# Patient Record
Sex: Male | Born: 2000 | Race: Black or African American | Hispanic: No | Marital: Single | Smoking: Current every day smoker
Health system: Southern US, Community
[De-identification: ages and names within clinical notes are randomized; demographics above are authoritative.]

## PROBLEM LIST (undated history)

## (undated) HISTORY — PX: KNEE ARTHROSCOPY WITH ANTERIOR CRUCIATE LIGAMENT (ACL) REPAIR: SHX5644

---
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
## 2022-10-25 ENCOUNTER — Other Ambulatory Visit: Payer: Self-pay

## 2022-10-25 ENCOUNTER — Emergency Department
Admission: EM | Admit: 2022-10-25 | Discharge: 2022-10-25 | Disposition: A | Discharge status: Home / Self Care | Payer: Managed Care, Other (non HMO) | Attending: Emergency Medicine | Admitting: Emergency Medicine

## 2022-10-25 ENCOUNTER — Encounter: Payer: Self-pay | Admitting: *Deleted

## 2022-10-25 DIAGNOSIS — L0231 Cutaneous abscess of buttock: Secondary | ICD-10-CM | POA: Diagnosis present

## 2022-10-25 MED ORDER — LIDOCAINE-EPINEPHRINE 1 %-1:100000 IJ SOLN
20.0000 mL | Freq: Once | INTRAMUSCULAR | Status: AC
  Administered 2022-10-25: 20 mL
  Filled 2022-10-25: qty 1

## 2022-10-25 MED ORDER — CEPHALEXIN 500 MG PO CAPS
500.0000 mg | ORAL_CAPSULE | Freq: Once | ORAL | Status: AC
  Administered 2022-10-25: 500 mg via ORAL
  Filled 2022-10-25: qty 1

## 2022-10-25 MED ORDER — CEPHALEXIN 500 MG PO CAPS: 500.0000 mg | ORAL_CAPSULE | Freq: Four times a day (QID) | ORAL | 0 refills | Status: AC

## 2022-10-25 NOTE — ED Provider Notes (Signed)
[facility] Provider Note    Event Date/Time   First MD Initiated Contact with Patient 10/25/22 1509     (approximate)   History   Insect Bite and Abscess   HPI  Brandon Owen is a 22 y.o. male   Past medical history of no significant past medical history presents the emergency department with a bump on his left buttock for the last 3 days, painful, redness.  No systemic symptoms of illness like fever or chills.      Physical Exam   Triage Vital Signs: ED Triage Vitals  Encounter Vitals Group     BP 10/25/22 1239 130/73     Systolic BP Percentile --      Diastolic BP Percentile --      Pulse Rate 10/25/22 1239 83     Resp 10/25/22 1239 18     Temp 10/25/22 1239 98.5 F (36.9 C)     Temp Source 10/25/22 1239 Oral     SpO2 10/25/22 1239 98 %     Weight 10/25/22 1237 190 lb (86.2 kg)     Height 10/25/22 1237 6\' 2"  (1.88 m)     Head Circumference --      Peak Flow --      Pain Score 10/25/22 1237 8     Pain Loc --      Pain Education --      Exclude from Growth Chart --     Most recent vital signs: Vitals:   10/25/22 1239  BP: 130/73  Pulse: 83  Resp: 18  Temp: 98.5 F (36.9 C)  SpO2: 98%    General: Awake, no distress.  CV:  Good peripheral perfusion.  Resp:  Normal effort.  Abd:  No distention.  Other:  Indurated cellulitic change with this tiny scab overlying the left buttock.  No obvious fluctuance.  No fever nontoxic appearing patient, ultrasound with small fluid collection   ED Results / Procedures / Treatments   Labs (all labs ordered are listed, but only abnormal results are displayed) Labs Reviewed - No data to display  PROCEDURES:  Critical Care performed: No  ..Incision and Drainage  Date/Time: 10/25/2022 4:21 PM  Performed by: Pilar Jarvis, MD Authorized by: Pilar Jarvis, MD   Consent:    Consent obtained:  Verbal   Consent given by:  Patient   Risks discussed:  Bleeding, damage to other organs,  incomplete drainage and infection   Alternatives discussed:  No treatment and delayed treatment Universal protocol:    Procedure explained and questions answered to patient or proxy's satisfaction: yes     Patient identity confirmed:  Verbally with patient Location:    Type:  Abscess   Size:  3cm   Location:  Anogenital   Anogenital location: buttock L. Pre-procedure details:    Skin preparation:  Chlorhexidine with alcohol Sedation:    Sedation type:  None Anesthesia:    Anesthesia method:  Local infiltration   Local anesthetic:  Lidocaine 1% WITH epi Procedure type:    Complexity:  Complex Procedure details:    Incision types:  Stab incision   Incision depth:  Dermal   Wound management:  Probed and deloculated   Drainage:  Bloody and purulent   Drainage amount:  Scant   Wound treatment:  Wound left open   Packing materials:  None Post-procedure details:    Procedure completion:  Tolerated    MEDICATIONS ORDERED IN ED: Medications  lidocaine-EPINEPHrine (XYLOCAINE W/EPI) 1 %-1:100000 (with  pres) injection 20 mL (20 mLs Other Given 10/25/22 1604)  cephALEXin (KEFLEX) capsule 500 mg (500 mg Oral Given 10/25/22 1603)     IMPRESSION / MDM / ASSESSMENT AND PLAN / ED COURSE  I reviewed the triage vital signs and the nursing notes.                                Patient's presentation is most consistent with acute presentation with potential threat to life or bodily function.  Differential diagnosis includes, but is not limited to, abscess/cellulitis   The patient is on the cardiac monitor to evaluate for evidence of arrhythmia and/or significant heart rate changes.  MDM:    Abscess drained, covered for cellulitis, tolerated well.  No signs of sepsis.  No signs of necrotizing fasciitis.  Discharge.      FINAL CLINICAL IMPRESSION(S) / ED DIAGNOSES   Final diagnoses:  Left buttock abscess     Rx / DC Orders   ED Discharge Orders          Ordered     cephALEXin (KEFLEX) 500 MG capsule  4 times daily        10/25/22 1559             Note:  This document was prepared using Dragon voice recognition software and may include unintentional dictation errors.    Pilar Jarvis, MD 10/25/22 [phone]

## 2022-10-25 NOTE — Discharge Instructions (Addendum)
Please keep your wound clean by washing at least daily with soap and water. If you see any signs of infection like spreading redness, pus coming from the wound, extreme pain, fevers, chills or any other worsening doctor right away or come back to the emergency department.  Take antibiotics.   Thank you for choosing [place] for your health care today!  Please see your primary doctor this week for a follow up appointment.   If you have any new, worsening, or unexpected symptoms call your doctor right away or come back to the emergency department for reevaluation.  It was my pleasure to care for you today.   Daneil Dan Modesto Charon, MD

## 2022-10-25 NOTE — ED Triage Notes (Signed)
Here by POV from home for L upper outer buttocks redness and hardness c/w insect bite/ abscess, here for "spider bite". Redness and induration noted. Alert, NAD, calm, steady gait. Denies other sx. Denies fever or drainage. Denies meds or topicals. Has cleaned with alcohol. First noticed 2d ago.

## 2023-04-01 ENCOUNTER — Ambulatory Visit: Admitting: Family Medicine

## 2023-04-01 ENCOUNTER — Encounter: Payer: Self-pay | Admitting: Family Medicine

## 2023-04-01 VITALS — BP 138/89 | HR 77 | Temp 98.8°F | Resp 20 | Ht 74.0 in | Wt 232.0 lb

## 2023-04-01 DIAGNOSIS — M79644 Pain in right finger(s): Secondary | ICD-10-CM | POA: Diagnosis not present

## 2023-04-01 DIAGNOSIS — G6289 Other specified polyneuropathies: Secondary | ICD-10-CM

## 2023-04-01 DIAGNOSIS — G629 Polyneuropathy, unspecified: Secondary | ICD-10-CM | POA: Insufficient documentation

## 2023-04-01 MED ORDER — GABAPENTIN 100 MG PO CAPS: 100.0000 mg | ORAL_CAPSULE | Freq: Three times a day (TID) | ORAL | 3 refills | Status: AC

## 2023-04-01 NOTE — Progress Notes (Signed)
 New Patient Office Visit  Subjective    Patient ID: Brandon Owen, male    DOB: 2000/07/29  Age: 23 y.o. MRN: [id number]  CC:  Chief Complaint  Patient presents with   Leg Pain   Hand Pain    Slammed hand in car door 3 wks ago. Hard to make finger go straight.   Mass    Middle of chest from prior brace wearing in middle school    HPI Brandon Owen presents to establish care Delightful 23 yo who got his right hand caught in the door of his car when the wind slammed the door 3 weeks ago.  He had pain and swelling over his fifth metacarpal distally and at the PIP joint.  The pain and swelling have subsided but now he has weakness when he tries to Abduct his extended finger.  He cannot get the finger fully abducted. For the last 2 to 3 years he has had bilateral burning in his feet and itching.  He takes his socks and shoes off pulls his feet down and the symptoms abate.  Works on concrete 6 to 7 hours a day with [employer].  Reports no back pain and no lancing pains in his legs.  Does not have itching or burning anywhere else. He has a history of an ACL tear repair left.  Outpatient Encounter Medications as of 04/01/2023  Medication Sig   gabapentin (NEURONTIN) 100 MG capsule Take 1 capsule (100 mg total) by mouth 3 (three) times daily.   meloxicam (MOBIC) 15 MG tablet Take 1 tablet (15 mg total) by mouth daily.   No facility-administered encounter medications on file as of 04/01/2023.    History reviewed. No pertinent past medical history.  Past Surgical History:  Procedure Laterality Date   KNEE ARTHROSCOPY WITH ANTERIOR CRUCIATE LIGAMENT (ACL) REPAIR Left     Family History  Problem Relation Age of Onset   GER disease Brother     Social History   Socioeconomic History   Marital status: Single    Spouse name: Not on file   Number of children: Not on file   Years of education: Not on file   Highest education level: Not on file  Occupational History   Not on file  Tobacco  Use   Smoking status: Every Day    Types: Cigars    Passive exposure: Current   Smokeless tobacco: Never  Substance and Sexual Activity   Alcohol use: Yes   Drug use: Yes    Types: Marijuana   Sexual activity: Not on file  Other Topics Concern   Not on file  Social History Narrative   Not on file   Social Drivers of Health   Financial Resource Strain: Not on file  Food Insecurity: Not on file  Transportation Needs: Not on file  Physical Activity: Not on file  Stress: Not on file  Social Connections: Not on file  Intimate Partner Violence: Not on file    ROS      Objective   BP 138/89 (BP Location: Left Arm, Patient Position: Sitting, Cuff Size: Normal)   Pulse 77   Temp 98.8 F (37.1 C) (Oral)   Resp 20   Ht 6\' 2"  (1.88 m)   Wt 232 lb (105.2 kg)   SpO2 96%   BMI 29.79 kg/m    Physical Exam Vitals and nursing note reviewed.  Constitutional:      Appearance: Normal appearance.  HENT:     Head: Normocephalic and  atraumatic.  Eyes:     Conjunctiva/sclera: Conjunctivae normal.  Cardiovascular:     Rate and Rhythm: Normal rate and regular rhythm.  Pulmonary:     Effort: Pulmonary effort is normal.     Breath sounds: Normal breath sounds.  Musculoskeletal:     Right lower leg: No edema.     Left lower leg: No edema.  Skin:    General: Skin is warm and dry.  Neurological:     Mental Status: He is alert and oriented to person, place, and time.  Psychiatric:        Mood and Affect: Mood normal.        Behavior: Behavior normal.        Thought Content: Thought content normal.        Judgment: Judgment normal.      oot Exam  Perform Detailed exam:1} Diabetic foot exam was performed with the following findings:   No deformities, ulcerations, or other skin breakdown Normal sensation of 10g monofilament Intact posterior tibialis and dorsalis pedis pulses Pes planus bilaterally.        The ASCVD Risk score (Arnett DK, et al., 2019) failed to  calculate for the following reasons:   The 2019 ASCVD risk score is only valid for ages 24 to 24     Assessment & Plan:  Finger pain, right Assessment & Plan: He is having weakness when he tries to Abduct his extended finger.  Got is caught in a car door.  He has been icing it down and the swelling has abated.  Will check x-ray and refer to Ortho  Orders: -     DG Finger Little Right; Future  Other polyneuropathy Assessment & Plan: Reports he has intense itching of his feet and burning sensation.  Takes his shoes off and airs and cools feet and sensation stops.  No rash on feet.  Normal monofilament exam. Has been happening for 2-3 years.  Positive family history of DMT2.  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  Wears new balance tennis shoes.  Checking EMG and NCS.  Trial of gabapentin 100 mg TID.  Orders: -     Ambulatory referral to Neurology  Other orders -     Gabapentin; Take 1 capsule (100 mg total) by mouth 3 (three) times daily.  Dispense: 90 capsule; Refill: 3    Return in about 4 weeks (around 04/29/2023).   Alease Medina, MD

## 2023-04-01 NOTE — Assessment & Plan Note (Addendum)
 Reports he has intense itching of his feet and burning sensation.  Takes his shoes off and airs and cools feet and sensation stops.  No rash on feet.  Normal monofilament exam. Has been happening for 2-3 years.  Positive family history of DMT2.  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  Wears new balance tennis shoes.  Checking EMG and NCS.  Trial of gabapentin 100 mg TID.

## 2023-04-01 NOTE — Assessment & Plan Note (Signed)
 He is having weakness when he tries to Abduct his extended finger.  Got is caught in a car door.  He has been icing it down and the swelling has abated.  Will check x-ray and refer to Ortho

## 2023-05-01 ENCOUNTER — Ambulatory Visit: Admitting: Family Medicine

## 2024-01-17 ENCOUNTER — Emergency Department
Admission: EM | Admit: 2024-01-17 | Discharge: 2024-01-18 | Disposition: A | Discharge status: Other Acute Inpatient Hospital

## 2024-01-17 ENCOUNTER — Other Ambulatory Visit: Payer: Self-pay

## 2024-01-17 DIAGNOSIS — W268XXA Contact with other sharp object(s), not elsewhere classified, initial encounter: Secondary | ICD-10-CM | POA: Insufficient documentation

## 2024-01-17 DIAGNOSIS — Z5900 Homelessness unspecified: Secondary | ICD-10-CM | POA: Insufficient documentation

## 2024-01-17 DIAGNOSIS — F419 Anxiety disorder, unspecified: Secondary | ICD-10-CM | POA: Insufficient documentation

## 2024-01-17 DIAGNOSIS — S61512A Laceration without foreign body of left wrist, initial encounter: Secondary | ICD-10-CM

## 2024-01-17 DIAGNOSIS — F322 Major depressive disorder, single episode, severe without psychotic features: Secondary | ICD-10-CM | POA: Insufficient documentation

## 2024-01-17 DIAGNOSIS — R45851 Suicidal ideations: Secondary | ICD-10-CM | POA: Insufficient documentation

## 2024-01-17 DIAGNOSIS — Z23 Encounter for immunization: Secondary | ICD-10-CM | POA: Insufficient documentation

## 2024-01-17 DIAGNOSIS — Z79899 Other long term (current) drug therapy: Secondary | ICD-10-CM | POA: Insufficient documentation

## 2024-01-17 DIAGNOSIS — S51812A Laceration without foreign body of left forearm, initial encounter: Secondary | ICD-10-CM | POA: Insufficient documentation

## 2024-01-17 LAB — COMPREHENSIVE METABOLIC PANEL WITH GFR
ALT: 32 U/L (ref 0–44)
AST: 24 U/L (ref 15–41)
Albumin: 4.7 g/dL (ref 3.5–5.0)
Alkaline Phosphatase: 87 U/L (ref 38–126)
Anion gap: 15 (ref 5–15)
BUN: 8 mg/dL (ref 6–20)
CO2: 21 mmol/L — ABNORMAL LOW (ref 22–32)
Calcium: 9 mg/dL (ref 8.9–10.3)
Chloride: 104 mmol/L (ref 98–111)
Creatinine, Ser: 1.23 mg/dL (ref 0.61–1.24)
GFR, Estimated: >60 mL/min
Glucose, Bld: 95 mg/dL (ref 70–99)
Potassium: 3.6 mmol/L (ref 3.5–5.1)
Sodium: 140 mmol/L (ref 135–145)
Total Bilirubin: 0.5 mg/dL (ref 0.0–1.2)
Total Protein: 7.7 g/dL (ref 6.5–8.1)

## 2024-01-17 LAB — CBC
HCT: 44.5 % (ref 39.0–52.0)
Hemoglobin: 15.6 g/dL (ref 13.0–17.0)
MCH: 29.8 pg (ref 26.0–34.0)
MCHC: 35.1 g/dL (ref 30.0–36.0)
MCV: 85.1 fL (ref 80.0–100.0)
Platelets: 322 K/uL (ref 150–400)
RBC: 5.23 MIL/uL (ref 4.22–5.81)
RDW: 12.4 % (ref 11.5–15.5)
WBC: 8.9 K/uL (ref 4.0–10.5)
nRBC: 0 % (ref 0.0–0.2)

## 2024-01-17 LAB — ETHANOL: Alcohol, Ethyl (B): <15 mg/dL

## 2024-01-17 MED ORDER — LIDOCAINE-EPINEPHRINE 2 %-1:100000 IJ SOLN
1.7000 mL | Freq: Once | INTRAMUSCULAR | Status: AC
  Administered 2024-01-17: 1.7 mL via INTRADERMAL
  Filled 2024-01-17: qty 1

## 2024-01-17 NOTE — ED Provider Notes (Signed)
 "  [facility] Provider Note    Event Date/Time   First MD Initiated Contact with Patient 01/17/24 2148     (approximate)   History   Chief Complaint Suicide Attempt   HPI  Brandon Owen is a 24 y.o. male with no significant past medical history who presents to the ED complaining of suicide attempt.  Patient reports that just prior to arrival he attempted to end his life by cutting his left arm with a device used to trim eyelashes.  He was subsequently brought to the ED by family, reports significant bleeding at home but no ongoing bleeding here in the ED.  He denies any difficulty moving his left arm or hand, does not have any numbness or tingling.     Physical Exam   Triage Vital Signs: ED Triage Vitals  Encounter Vitals Group     BP 01/17/24 2148 (!) 149/92     Girls Systolic BP Percentile --      Girls Diastolic BP Percentile --      Boys Systolic BP Percentile --      Boys Diastolic BP Percentile --      Pulse Rate 01/17/24 2148 78     Resp 01/17/24 2148 18     Temp 01/17/24 2148 97.7 F (36.5 C)     Temp Source 01/17/24 2148 Oral     SpO2 01/17/24 2148 98 %     Weight 01/17/24 2145 230 lb (104.3 kg)     Height 01/17/24 2145 6' 2 (1.88 m)     Head Circumference --      Peak Flow --      Pain Score 01/17/24 2145 2     Pain Loc --      Pain Education --      Exclude from Growth Chart --     Most recent vital signs: Vitals:   01/17/24 2230 01/17/24 2300  BP: 131/80 (!) 148/82  Pulse: 77 76  Resp: 14 15  Temp:    SpO2: 100% 100%    Constitutional: Alert and oriented. Eyes: Conjunctivae are normal. Head: Atraumatic. Nose: No congestion/rhinnorhea. Mouth/Throat: Mucous membranes are moist.  Cardiovascular: Normal rate, regular rhythm. Grossly normal heart sounds.  2+ radial pulses bilaterally. Respiratory: Normal respiratory effort.  No retractions. Lungs CTAB. Gastrointestinal: Soft and nontender. No distention. Musculoskeletal:  No lower extremity tenderness nor edema.  Approximately 12 cm laceration to the left forearm, no active bleeding noted.  Range of motion intact throughout the left hand with cap refill less than 2 seconds. Neurologic:  Normal speech and language. No gross focal neurologic deficits are appreciated.    ED Results / Procedures / Treatments   Labs (all labs ordered are listed, but only abnormal results are displayed) Labs Reviewed  COMPREHENSIVE METABOLIC PANEL WITH GFR - Abnormal; Notable for the following components:      Result Value   CO2 21 (*)    All other components within normal limits  ETHANOL  CBC  URINE DRUG SCREEN    PROCEDURES:  Critical Care performed: No  .Laceration Repair  Date/Time: 01/18/2024 12:58 AM  Performed by: Willo Dunnings, MD Authorized by: Willo Dunnings, MD   Consent:    Consent obtained:  Verbal   Consent given by:  Patient   Risks, benefits, and alternatives were discussed: yes   Universal protocol:    Patient identity confirmed:  Verbally with patient and arm band Anesthesia:    Anesthesia method:  Local infiltration  Local anesthetic:  Lidocaine  2% WITH epi Laceration details:    Location:  Shoulder/arm   Shoulder/arm location:  L lower arm   Length (cm):  12 Exploration:    Hemostasis achieved with:  Epinephrine  and direct pressure   Wound exploration: wound explored through full range of motion and entire depth of wound visualized     Wound extent: areolar tissue not violated, fascia not violated, no foreign body, no signs of injury, no nerve damage, no tendon damage, no underlying fracture and no vascular damage     Contaminated: no   Treatment:    Area cleansed with:  Saline   Amount of cleaning:  Standard   Irrigation solution:  Sterile saline   Irrigation method:  Pressure wash   Debridement:  None   Undermining:  None   Scar revision: no   Skin repair:    Repair method:  Sutures   Suture size:  3-0   Suture material:   Nylon   Suture technique:  Simple interrupted   Number of sutures:  10 Approximation:    Approximation:  Close Repair type:    Repair type:  Simple Post-procedure details:    Dressing:  Open (no dressing)   Procedure completion:  Tolerated well, no immediate complications    MEDICATIONS ORDERED IN ED: Medications  Tdap (ADACEL ) injection 0.5 mL (has no administration in time range)  lidocaine -EPINEPHrine  (XYLOCAINE  W/EPI) 2 %-1:100000 (with pres) injection 1.7 mL (1.7 mLs Intradermal Given by Other 01/17/24 2201)     IMPRESSION / MDM / ASSESSMENT AND PLAN / ED COURSE  I reviewed the triage vital signs and the nursing notes.                              24 y.o. male with no significant past medical history who presents to the ED complaining of self-inflicted laceration to the left forearm just prior to arrival and a suicide attempt.  Patient's presentation is most consistent with acute presentation with potential threat to life or bodily function.  Differential diagnosis includes, but is not limited to, laceration, vascular injury, tendon injury, nerve injury, suicide attempt.  Patient nontoxic-appearing and in no acute distress, vital signs are unremarkable.  He is neurovascularly intact to his distal left upper extremity with range of motion intact throughout his hand.  No evidence of tendon or vascular injury at this time, wound was copiously irrigated with saline and repaired.  Labs without significant anemia, leukocytosis, electrolyte abnormality, or AKI.  LFTs are also unremarkable.  Patient may be medically cleared for psychiatric disposition, was placed under IVC.  The patient has been placed in psychiatric observation due to the need to provide a safe environment for the patient while obtaining psychiatric consultation and evaluation, as well as ongoing medical and medication management to treat the patient's condition.  The patient has been placed under full IVC at this  time.       FINAL CLINICAL IMPRESSION(S) / ED DIAGNOSES   Final diagnoses:  Self-inflicted laceration of left wrist (HCC)  Suicidal ideation     Rx / DC Orders   ED Discharge Orders     None        Note:  This document was prepared using Dragon voice recognition software and may include unintentional dictation errors.   Willo Dunnings, MD 01/18/24 0104  "

## 2024-01-17 NOTE — ED Triage Notes (Signed)
 Pt ambulatory to triage with laceration to LFA. Bleeding active. Compression bandage placed. Hx of self harm per pt report and pt endorses cutting self today in an SI attempt. States no new stressor today and cuts self daily. Denies any prescribed mental health medications but does report prior hospitalization. Pt continues to deny SI but denies HI. Denies pain otherwise. Pt alert and oriented following commands. Breathing unlabored with symmetric chest rise and fall. Flat affect. Calm and cooperative at this time. Pt wanded on arrival to triage room.

## 2024-01-17 NOTE — ED Notes (Addendum)
 Patient belongings includes White T-shirt  Blue underwear Elnor Spalding White shoes [employer] sweater  2 phones (2 shatter iphones) Black wallet 2 yellow studs

## 2024-01-17 NOTE — ED Notes (Signed)
 This tech is sitting 1:1 with patient

## 2024-01-18 ENCOUNTER — Inpatient Hospital Stay
Admission: EM | Admit: 2024-01-18 | Discharge: 2024-01-23 | DRG: 885 | Disposition: A | Discharge status: Home / Self Care | Payer: Self-pay | Source: Intra-hospital | Attending: Psychiatry | Admitting: Psychiatry

## 2024-01-18 DIAGNOSIS — Z91148 Patient's other noncompliance with medication regimen for other reason: Secondary | ICD-10-CM | POA: Diagnosis not present

## 2024-01-18 DIAGNOSIS — X789XXA Intentional self-harm by unspecified sharp object, initial encounter: Secondary | ICD-10-CM | POA: Diagnosis present

## 2024-01-18 DIAGNOSIS — F17290 Nicotine dependence, other tobacco product, uncomplicated: Secondary | ICD-10-CM | POA: Diagnosis present

## 2024-01-18 DIAGNOSIS — Z5900 Homelessness unspecified: Secondary | ICD-10-CM | POA: Diagnosis not present

## 2024-01-18 DIAGNOSIS — F332 Major depressive disorder, recurrent severe without psychotic features: Secondary | ICD-10-CM | POA: Diagnosis present

## 2024-01-18 DIAGNOSIS — S51812A Laceration without foreign body of left forearm, initial encounter: Secondary | ICD-10-CM | POA: Diagnosis present

## 2024-01-18 DIAGNOSIS — F419 Anxiety disorder, unspecified: Secondary | ICD-10-CM | POA: Diagnosis present

## 2024-01-18 DIAGNOSIS — Z9152 Personal history of nonsuicidal self-harm: Secondary | ICD-10-CM

## 2024-01-18 DIAGNOSIS — Z79899 Other long term (current) drug therapy: Secondary | ICD-10-CM | POA: Diagnosis not present

## 2024-01-18 DIAGNOSIS — S61512A Laceration without foreign body of left wrist, initial encounter: Secondary | ICD-10-CM | POA: Diagnosis present

## 2024-01-18 LAB — URINE DRUG SCREEN
Amphetamines: NEGATIVE
Barbiturates: NEGATIVE
Benzodiazepines: NEGATIVE
Cocaine: NEGATIVE
Fentanyl: NEGATIVE
Methadone Scn, Ur: NEGATIVE
Opiates: NEGATIVE
Tetrahydrocannabinol: POSITIVE — AB

## 2024-01-18 MED ORDER — ALUM & MAG HYDROXIDE-SIMETH 200-200-20 MG/5ML PO SUSP: 30.0000 mL | ORAL | Status: DC | PRN

## 2024-01-18 MED ORDER — MAGNESIUM HYDROXIDE 400 MG/5ML PO SUSP: 30.0000 mL | Freq: Every day | ORAL | Status: DC | PRN

## 2024-01-18 MED ORDER — THIAMINE HCL 100 MG/ML IJ SOLN
100.0000 mg | Freq: Every day | INTRAMUSCULAR | Status: DC
  Filled 2024-01-18: qty 2

## 2024-01-18 MED ORDER — LORAZEPAM 2 MG PO TABS
0.0000 mg | ORAL_TABLET | Freq: Four times a day (QID) | ORAL | Status: AC
  Administered 2024-01-19 – 2024-01-20 (×3): 2 mg via ORAL
  Filled 2024-01-18 (×3): qty 1

## 2024-01-18 MED ORDER — FOLIC ACID 1 MG PO TABS
1.0000 mg | ORAL_TABLET | Freq: Every day | ORAL | Status: DC
  Administered 2024-01-18 – 2024-01-23 (×6): 1 mg via ORAL
  Filled 2024-01-18 (×6): qty 1

## 2024-01-18 MED ORDER — DIPHENHYDRAMINE HCL 50 MG/ML IJ SOLN: 50.0000 mg | Freq: Three times a day (TID) | INTRAMUSCULAR | Status: DC | PRN

## 2024-01-18 MED ORDER — ADULT MULTIVITAMIN W/MINERALS CH
1.0000 | ORAL_TABLET | Freq: Every day | ORAL | Status: DC
  Administered 2024-01-18 – 2024-01-23 (×5): 1 via ORAL
  Filled 2024-01-18 (×5): qty 1

## 2024-01-18 MED ORDER — TRAZODONE HCL 50 MG PO TABS
50.0000 mg | ORAL_TABLET | Freq: Every evening | ORAL | Status: DC | PRN
  Administered 2024-01-18 – 2024-01-22 (×5): 50 mg via ORAL
  Filled 2024-01-18 (×5): qty 1

## 2024-01-18 MED ORDER — HALOPERIDOL 5 MG PO TABS: 5.0000 mg | ORAL_TABLET | Freq: Three times a day (TID) | ORAL | Status: DC | PRN

## 2024-01-18 MED ORDER — HALOPERIDOL LACTATE 5 MG/ML IJ SOLN: 5.0000 mg | Freq: Three times a day (TID) | INTRAMUSCULAR | Status: DC | PRN

## 2024-01-18 MED ORDER — FLUOXETINE HCL 10 MG PO CAPS
10.0000 mg | ORAL_CAPSULE | Freq: Every day | ORAL | Status: DC
  Administered 2024-01-18: 10 mg via ORAL
  Filled 2024-01-18: qty 1

## 2024-01-18 MED ORDER — TETANUS-DIPHTH-ACELL PERTUSSIS 5-2-15.5 LF-MCG/0.5 IM SUSP
0.5000 mL | Freq: Once | INTRAMUSCULAR | Status: AC
  Administered 2024-01-18: 0.5 mL via INTRAMUSCULAR
  Filled 2024-01-18: qty 0.5

## 2024-01-18 MED ORDER — GABAPENTIN 100 MG PO CAPS: 100.0000 mg | ORAL_CAPSULE | Freq: Three times a day (TID) | ORAL | Status: DC

## 2024-01-18 MED ORDER — HYDROXYZINE HCL 25 MG PO TABS
25.0000 mg | ORAL_TABLET | Freq: Three times a day (TID) | ORAL | Status: DC | PRN
  Administered 2024-01-18 – 2024-01-22 (×6): 25 mg via ORAL
  Filled 2024-01-18 (×6): qty 1

## 2024-01-18 MED ORDER — NICOTINE POLACRILEX 2 MG MT GUM
2.0000 mg | CHEWING_GUM | OROMUCOSAL | Status: DC | PRN
  Administered 2024-01-18 – 2024-01-22 (×2): 2 mg via ORAL
  Filled 2024-01-18 (×3): qty 1

## 2024-01-18 MED ORDER — ACETAMINOPHEN 325 MG PO TABS
650.0000 mg | ORAL_TABLET | Freq: Four times a day (QID) | ORAL | Status: DC | PRN
  Administered 2024-01-18 – 2024-01-23 (×7): 650 mg via ORAL
  Filled 2024-01-18 (×7): qty 2

## 2024-01-18 MED ORDER — LORAZEPAM 2 MG PO TABS: 0.0000 mg | ORAL_TABLET | Freq: Two times a day (BID) | ORAL | Status: AC

## 2024-01-18 MED ORDER — HALOPERIDOL LACTATE 5 MG/ML IJ SOLN: 10.0000 mg | Freq: Three times a day (TID) | INTRAMUSCULAR | Status: DC | PRN

## 2024-01-18 MED ORDER — DIPHENHYDRAMINE HCL 25 MG PO CAPS: 50.0000 mg | ORAL_CAPSULE | Freq: Three times a day (TID) | ORAL | Status: DC | PRN

## 2024-01-18 MED ORDER — THIAMINE MONONITRATE 100 MG PO TABS
100.0000 mg | ORAL_TABLET | Freq: Every day | ORAL | Status: DC
  Administered 2024-01-18 – 2024-01-23 (×5): 100 mg via ORAL
  Filled 2024-01-18 (×5): qty 1

## 2024-01-18 MED ORDER — FLUOXETINE HCL 10 MG PO CAPS
10.0000 mg | ORAL_CAPSULE | Freq: Every day | ORAL | Status: DC
  Administered 2024-01-19 – 2024-01-20 (×2): 10 mg via ORAL
  Filled 2024-01-18 (×2): qty 1

## 2024-01-18 MED ORDER — LORAZEPAM 2 MG/ML IJ SOLN: 2.0000 mg | Freq: Three times a day (TID) | INTRAMUSCULAR | Status: DC | PRN

## 2024-01-18 MED ORDER — GABAPENTIN 100 MG PO CAPS
100.0000 mg | ORAL_CAPSULE | Freq: Three times a day (TID) | ORAL | Status: DC
  Administered 2024-01-18 – 2024-01-23 (×14): 100 mg via ORAL
  Filled 2024-01-18 (×15): qty 1
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
## 2024-01-18 NOTE — ED Notes (Signed)
 NT and security transporting patient to BMU via wheelchair. IVC papers and all belongings taken with patient.

## 2024-01-18 NOTE — Progress Notes (Signed)
 24 year old patient admit for suicidal ideation and self inflicted injury to his left arm with sutures intact, dry no drainage noted. Patient on presentation is alert and oriented x 4, affect is flat and sad, his thoughts are linear and coherent , affect is congruent with mood, he denies SI/HI/AVH interacting appropriately with staff. Patient was offered emotional support, and encouragement to attend group in the dayroom during the day, and he was receptive to staff. Patient denies SI/HI/AVH ,15 minutes safety checks maintained, will continue to closely monitor .

## 2024-01-18 NOTE — BHH Suicide Risk Assessment (Cosign Needed)
 [facility] Admission Suicide Risk Assessment   Nursing information obtained from:  Patient Demographic factors:  Male, Adolescent or young adult, Low socioeconomic status Current Mental Status:  Self-harm behaviors Loss Factors:  Legal issues, Financial problems / change in socioeconomic status Historical Factors:  Impulsivity, Prior suicide attempts Risk Reduction Factors:  NA  Total Time spent with patient: 1 hour Principal Problem: MDD (major depressive disorder), recurrent episode, severe (HCC) Diagnosis:  Principal Problem:   MDD (major depressive disorder), recurrent episode, severe (HCC)  Subjective Data: Patient seen for admission assessment today on inpatient psychiatric unit following admission for self-inflicted laceration that patient reported was suicide attempt. Patient endorsed history of self-harm including cutting that he reported to staff he does almost daily. Today, he reports doing okay overall. Endorses difficulty sleeping, states environment reminds him of juvenile center he was placed in as a child. Started on Prozac  and gabapentin  by initial provider, reports tolerating medications well without noted side effects at this time. Denies current suicidal ideations, homicidal ideations, auditory hallucinations, and visual hallucinations. Endorses alcohol use, primarily liquor approximately three times weekly with friends. Denies history or current alcohol withdrawal symptoms. Denies history of withdrawal seizures. Smokes Black and Milds daily. Denies access to firearms. Not currently established with outpatient psychiatric services but expresses willingness to establish care. Reports needing to contact parole officer this week. Voiced concern regarding scheduled move to new apartment on Tuesday of next week. Requested access to Quran and assistance retrieving phone numbers from his phone. On current examination, no evidence of psychosis, mania, or responding to internal stimuli noted.  Patient displayed flattened depressed affect during exam but remained cooperative with this provider throughout assessment.   Continued Clinical Symptoms:    The Alcohol Use Disorders Identification Test, Guidelines for Use in Primary Care, Second Edition.  World Science Writer [facility]). Score between 0-7:  no or low risk or alcohol related problems. Score between 8-15:  moderate risk of alcohol related problems. Score between 16-19:  high risk of alcohol related problems. Score 20 or above:  warrants further diagnostic evaluation for alcohol dependence and treatment.   CLINICAL FACTORS:   Depression:   Impulsivity Insomnia Alcohol/Substance Abuse/Dependencies   Musculoskeletal: Strength & Muscle Tone: within normal limits Gait & Station: normal Patient leans: N/A  Psychiatric Specialty Exam:  Presentation  General Appearance:  Appropriate for Environment  Eye Contact: Fair  Speech: Clear and Coherent  Speech Volume: Normal  Handedness:No data recorded  Mood and Affect  Mood: Depressed  Affect: Congruent   Thought Process  Thought Processes: Coherent; Linear  Descriptions of Associations:No data recorded Orientation:Full (Time, Place and Person)  Thought Content:Logical  History of Schizophrenia/Schizoaffective disorder:No  Duration of Psychotic Symptoms:No data recorded Hallucinations:No data recorded Ideas of Reference:No data recorded Suicidal Thoughts:Suicidal Thoughts: No  Homicidal Thoughts:Homicidal Thoughts: No   Sensorium  Memory: Immediate Fair; Remote Fair; Recent Fair  Judgment: Fair  Insight: Present   Executive Functions  Concentration: Fair  Attention Span: Fair  Recall: [employer] of Knowledge: Fair  Language: Fair   Psychomotor Activity  Psychomotor Activity: Psychomotor Activity: Normal   Assets  Assets: Communication Skills; Desire for Improvement   Sleep  Sleep: Sleep:  Poor    Physical Exam: Physical Exam Pulmonary:     Effort: Pulmonary effort is normal.  Neurological:     Mental Status: He is alert and oriented to person, place, and time.    Review of Systems  Respiratory:  Negative for shortness of breath.  Cardiovascular:  Negative for chest pain.  Gastrointestinal:  Negative for abdominal pain, nausea and vomiting.  Neurological:  Negative for dizziness, weakness and headaches.  Psychiatric/Behavioral:  Positive for depression and substance abuse. Negative for hallucinations and suicidal ideas. The patient is nervous/anxious and has insomnia.    Blood pressure (!) 140/94, pulse 80, temperature 98 F (36.7 C), temperature source Oral, resp. rate 16, height 6' 2 (1.88 m), weight 102.1 kg. Body mass index is 28.89 kg/m.   COGNITIVE FEATURES THAT CONTRIBUTE TO RISK:  None    SUICIDE RISK:   Moderate:  Frequent suicidal ideation with limited intensity, and duration, some specificity in terms of plans, no associated intent, good self-control, limited dysphoria/symptomatology, some risk factors present, and identifiable protective factors, including available and accessible social support.  PLAN OF CARE: Admit patient to the inpatient psychiatric unit for further monitoring and stabilization  I certify that inpatient services furnished can reasonably be expected to improve the patient's condition.   Zelda Sharps, NP This note was created using Dragon dictation software. Please excuse any inadvertent transcription errors. Case was discussed with supervising physician Dr. Ruther who is agreeable with current plan.

## 2024-01-18 NOTE — Tx Team (Signed)
 Initial Treatment Plan 01/18/2024 8:02 AM [name] FMW:[id number]    PATIENT STRESSORS: Financial difficulties   Legal issue   Marital or family conflict   Medication change or noncompliance   Substance abuse     PATIENT STRENGTHS: Ability for insight  Motivation for treatment/growth    PATIENT IDENTIFIED PROBLEMS: Suicidal ideation     Depression                  DISCHARGE CRITERIA:  Improved stabilization in mood, thinking, and/or behavior Motivation to continue treatment in a less acute level of care Need for constant or close observation no longer present  PRELIMINARY DISCHARGE PLAN: Outpatient therapy  PATIENT/FAMILY INVOLVEMENT: This treatment plan has been presented to and reviewed with the patient, Brandon Owen, The patient and family have been given the opportunity to ask questions and make suggestions.  Brandon Lorelie Gary, RN 01/18/2024, 8:02 AM

## 2024-01-18 NOTE — BH Assessment (Signed)
 Comprehensive Clinical Assessment (CCA) Note  01/18/2024 Brandon Owen [id number]  Chief Complaint: Patient is a 24 year old male presenting to [facility] ED under IVC. Per triage note Pt ambulatory to triage with laceration to LFA. Bleeding active. Compression bandage placed. Hx of self harm per pt report and pt endorses cutting self today in an SI attempt. States no new stressor today and cuts self daily. Denies any prescribed mental health medications but does report prior hospitalization. Pt continues to deny SI but denies HI. Denies pain otherwise. Pt alert and oriented following commands. Breathing unlabored with symmetric chest rise and fall. Flat affect. Calm and cooperative at this time. Pt wanded on arrival to triage room. During assessment patient appears alert and oriented x4, calm and cooperative. Patient reports the reason why he hurt himself was due to knowing that I don't have anybody. Patient also reports being currently homeless I have to move back here, my car is in the shop, when I was in [place] I didn't haven' anybody to talk to. Patient reports I have my mom but she has her own life. Patient reports being raised by his father and living with him most of his life my mom would come get us  and reports that he lived with her at one point but I haven't lived with her since I was 70. Patient reports that today he used an eyebrow razor to self harm but I didn't realize I cut that deep. When I cut myself I feel like I'm punishing myself for putting myself in that situation. Patient is not currently engaged with a therapist, he does not have a psychiatrist and is not taking any mental health medications. Patient also reports some alcohol use where he reports some heavy use last year. Patient currently denies SI/HI/AH/VH.  Chief Complaint  Patient presents with   Suicide Attempt   Visit Diagnosis: Major Depressive Disorder, recurrent episode, severe    CCA Screening, Triage and  Referral (STR)  Patient Reported Information How did you hear about us ? Legal System  Referral name: No data recorded Referral phone number: No data recorded  Whom do you see for routine medical problems? No data recorded Practice/Facility Name: No data recorded Practice/Facility Phone Number: No data recorded Name of Contact: No data recorded Contact Number: No data recorded Contact Fax Number: No data recorded Prescriber Name: No data recorded Prescriber Address (if known): No data recorded  What Is the Reason for Your Visit/Call Today? Pt ambulatory to triage with laceration to LFA. Bleeding active. Compression bandage placed. Hx of self harm per pt report and pt endorses cutting self today in an SI attempt. States no new stressor today and cuts self daily. Denies any prescribed mental health medications but does report prior hospitalization. Pt continues to deny SI but denies HI. Denies pain otherwise. Pt alert and oriented following commands. Breathing unlabored with symmetric chest rise and fall. Flat affect. Calm and cooperative at this time. Pt wanded on arrival to triage room.  How Long Has This Been Causing You Problems? > than 6 months  What Do You Feel Would Help You the Most Today? Treatment for Depression or other mood problem   Have You Recently Been in Any Inpatient Treatment (Hospital/Detox/Crisis Center/28-Day Program)? No data recorded Name/Location of Program/Hospital:No data recorded How Long Were You There? No data recorded When Were You Discharged? No data recorded  Have You Ever Received Services From [facility] Before? No data recorded Who Do You See at [facility]? No  data recorded  Have You Recently Had Any Thoughts About Hurting Yourself? Yes  Are You Planning to Commit Suicide/Harm Yourself At This time? No   Have you Recently Had Thoughts About Hurting Someone [place]? No  Explanation: No data recorded  Have You Used Any Alcohol or Drugs in the  Past 24 Hours? Yes  How Long Ago Did You Use Drugs or Alcohol? No data recorded What Did You Use and How Much? Alcohol   Do You Currently Have a Therapist/Psychiatrist? No  Name of Therapist/Psychiatrist: No data recorded  Have You Been Recently Discharged From Any Office Practice or Programs? No  Explanation of Discharge From Practice/Program: No data recorded    CCA Screening Triage Referral Assessment Type of Contact: Face-to-Face  Is this Initial or Reassessment? No data recorded Date Telepsych consult ordered in [facility]:  No data recorded Time Telepsych consult ordered in [facility]:  No data recorded  Patient Reported Information Reviewed? No data recorded Patient Left Without Being Seen? No data recorded Reason for Not Completing Assessment: No data recorded  Collateral Involvement: No data recorded  Does Patient Have a Court Appointed Legal Guardian? No data recorded Name and Contact of Legal Guardian: No data recorded If Minor and Not Living with Parent(s), Who has Custody? No data recorded Is CPS involved or ever been involved? Never  Is APS involved or ever been involved? Never   Patient Determined To Be At Risk for Harm To Self or Others Based on Review of Patient Reported Information or Presenting Complaint? Yes, for Self-Harm  Method: No data recorded Availability of Means: No data recorded Intent: No data recorded Notification Required: No data recorded Additional Information for Danger to Others Potential: No data recorded Additional Comments for Danger to Others Potential: No data recorded Are There Guns or Other Weapons in Your Home? No  Types of Guns/Weapons: No data recorded Are These Weapons Safely Secured?                            No data recorded Who Could Verify You Are Able To Have These Secured: No data recorded Do You Have any Outstanding Charges, Pending Court Dates, Parole/Probation? No data recorded Contacted To Inform of Risk of Harm To Self or  Others: No data recorded  Location of Assessment: [facility] ED   Does Patient Present under Involuntary Commitment? Yes  IVC Papers Initial File Date: No data recorded  [place] of Residence: [place]   Patient Currently Receiving the Following Services: No data recorded  Determination of Need: Emergent (2 hours)   Options For Referral: No data recorded    CCA Biopsychosocial Intake/Chief Complaint:  No data recorded Current Symptoms/Problems: No data recorded  Patient Reported Schizophrenia/Schizoaffective Diagnosis in Past: No   Strengths: Patient is able to communicate his needs  Preferences: No data recorded Abilities: No data recorded  Type of Services Patient Feels are Needed: No data recorded  Initial Clinical Notes/Concerns: No data recorded  Mental Health Symptoms Depression:  Change in energy/activity; Fatigue; Hopelessness   Duration of Depressive symptoms: Greater than two weeks   Mania:  None   Anxiety:   Fatigue; Restlessness; Worrying   Psychosis:  None   Duration of Psychotic symptoms: No data recorded  Trauma:  None   Obsessions:  None   Compulsions:  None   Inattention:  None   Hyperactivity/Impulsivity:  None   Oppositional/Defiant Behaviors:  None   Emotional Irregularity:  None  Other Mood/Personality Symptoms:  No data recorded   Mental Status Exam Appearance and self-care  Stature:  Average   Weight:  Average weight   Clothing:  Casual   Grooming:  Normal   Cosmetic use:  None   Posture/gait:  Normal   Motor activity:  Not Remarkable   Sensorium  Attention:  Normal   Concentration:  Normal   Orientation:  X5   Recall/memory:  Normal   Affect and Mood  Affect:  Appropriate   Mood:  Depressed   Relating  Eye contact:  Normal   Facial expression:  Responsive   Attitude toward examiner:  Cooperative   Thought and Language  Speech flow: Clear and Coherent   Thought content:  Appropriate to Mood and  Circumstances   Preoccupation:  None   Hallucinations:  None   Organization:  No data recorded  [employer] of Knowledge:  Fair   Intelligence:  Average   Abstraction:  Normal   Judgement:  Fair   Brewing Technologist   Insight:  Fair   Decision Making:  Impulsive   Social Functioning  Social Maturity:  Isolates   Social Judgement:  Normal   Stress  Stressors:  Housing; Surveyor, Quantity; Transitions   Coping Ability:  Exhausted   Skill Deficits:  None   Supports:  Support needed     Religion: Religion/Spirituality Are You A Religious Person?: No  Leisure/Recreation: Leisure / Recreation Do You Have Hobbies?: No  Exercise/Diet: Exercise/Diet Do You Exercise?: No Have You Gained or Lost A Significant Amount of Weight in the Past Six Months?: No Do You Follow a Special Diet?: No Do You Have Any Trouble Sleeping?: No   CCA Employment/Education Employment/Work Situation: Employment / Work Situation Employment Situation: Employed Work Stressors: none reported Has Patient ever Been in Equities Trader?: No  Education: Education Is Patient Currently Attending School?: No Did You Have An Individualized Education Program (IIEP): No Did You Have Any Difficulty At [employer]?: No Patient's Education Has Been Impacted by Current Illness: No   CCA Family/Childhood History Family and Relationship History: Family history Marital status: Single Does patient have children?: Yes How many children?: 2 How is patient's relationship with their children?: Unknown at this time  Childhood History:  Childhood History By whom was/is the patient raised?: Father Did patient suffer any verbal/emotional/physical/sexual abuse as a child?: No Did patient suffer from severe childhood neglect?: No Has patient ever been sexually abused/assaulted/raped as an adolescent or adult?: No Was the patient ever a victim of a crime or a disaster?: No Witnessed domestic  violence?: No Has patient been affected by domestic violence as an adult?: No  Child/Adolescent Assessment:     CCA Substance Use Alcohol/Drug Use: Alcohol / Drug Use Pain Medications: see mar Prescriptions: see mar Over the Counter: see mar History of alcohol / drug use?: Yes Substance #1 Name of Substance 1: Alcohol 1 - Age of First Use: unknown 1 - Amount (size/oz): history of daily use but has decreased 1 - Frequency: unknown 1 - Last Use / Amount: 01/17/24                       ASAM's:  Six Dimensions of Multidimensional Assessment  Dimension 1:  Acute Intoxication and/or Withdrawal Potential:      Dimension 2:  Biomedical Conditions and Complications:      Dimension 3:  Emotional, Behavioral, or Cognitive Conditions and Complications:     Dimension 4:  Readiness to Change:     Dimension 5:  Relapse, Continued use, or Continued Problem Potential:     Dimension 6:  Recovery/Living Environment:     ASAM Severity Score:    ASAM Recommended Level of Treatment:     Substance use Disorder (SUD)    Recommendations for Services/Supports/Treatments:    DSM5 Diagnoses: Patient Active Problem List   Diagnosis Date Noted   Finger pain, right 04/01/2023   Peripheral neuropathy 04/01/2023    Patient Centered Plan: Patient is on the following Treatment Plan(s):  Depression   Referrals to Alternative Service(s): Referred to Alternative Service(s):   Place:   Date:   Time:    Referred to Alternative Service(s):   Place:   Date:   Time:    Referred to Alternative Service(s):   Place:   Date:   Time:    Referred to Alternative Service(s):   Place:   Date:   Time:      @BHCOLLABOFCARE @  [employer], LCAS-A

## 2024-01-18 NOTE — ED Notes (Signed)
 IVC/pending BMU admission

## 2024-01-18 NOTE — Progress Notes (Signed)
" °   01/18/24 1100  Psych Admission Type (Psych Patients Only)  Admission Status Involuntary  Psychosocial Assessment  Patient Complaints Anxiety  Eye Contact Brief  Facial Expression Flat  Affect Flat  Speech Logical/coherent  Interaction Assertive  Motor Activity Other (Comment) (WDL)  Appearance/Hygiene In scrubs  Behavior Characteristics Cooperative;Appropriate to situation  Mood Depressed  Aggressive Behavior  Effect No apparent injury  Thought Process  Coherency WDL  Content WDL  Delusions None reported or observed  Perception WDL  Hallucination None reported or observed  Judgment Impaired  Confusion None  Danger to Self  Current suicidal ideation? Denies  Description of Suicide Plan no plan  Agreement Not to Harm Self Yes  Description of Agreement Verbal  Danger to Others  Danger to Others  (denies)    "

## 2024-01-18 NOTE — ED Notes (Signed)
 Per charge RN and Dr. Fernand, patient does not need 1:1 sitter despite concern for suicide attempt prior to arrival to ED, d/t 15 minutes rover rounding in quad area.
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
## 2024-01-18 NOTE — ED Notes (Signed)
 Attempted to call report to BMU; unable to take report at this time d/t room assigned being held for different patient. BMU Charge RN contacting [facility] for further direction.

## 2024-01-18 NOTE — Tx Team (Signed)
 Initial Treatment Plan 01/18/2024 11:47 AM Brandon Owen FMW:[id number]    PATIENT STRESSORS: Financial difficulties     PATIENT STRENGTHS: Religious Affiliation  Supportive family/friends  Work skills    PATIENT IDENTIFIED PROBLEMS: Self harm                     DISCHARGE CRITERIA:  Safe-care adequate arrangements made  PRELIMINARY DISCHARGE PLAN: Return to previous living arrangement Return to previous work or school arrangements  PATIENT/FAMILY INVOLVEMENT: This treatment plan has been presented to and reviewed with the patient, Brandon Owen, and/or family member, Mother.  The patient and family have been given the opportunity to ask questions and make suggestions.  Jon JULIANNA Lucks, RN 01/18/2024, 11:47 AM

## 2024-01-18 NOTE — ED Notes (Signed)
 Requested urine sample from patient. Pt reports he is going to try to give one. Specimen cup provided to patient.

## 2024-01-18 NOTE — Plan of Care (Signed)

## 2024-01-18 NOTE — ED Notes (Signed)
 Kerlix dressing taken off LFA wound d/t strangulation risk. Wound is clean and dry with sutures intact.

## 2024-01-18 NOTE — Group Note (Signed)
 Date:  01/18/2024 Time:  9:53 PM  Group Topic/Focus:  Managing Feelings:   The focus of this group is to identify what feelings patients have difficulty handling and develop a plan to handle them in a healthier way upon discharge. Self Care:   The focus of this group is to help patients understand the importance of self-care in order to improve or restore emotional, physical, spiritual, interpersonal, and financial health. Wrap-Up Group:   The focus of this group is to help patients review their daily goal of treatment and discuss progress on daily workbooks.    Participation Level:  Active  Participation Quality:  Appropriate and Attentive  Affect:  Appropriate  Cognitive:  Alert, Appropriate, and Oriented  Insight: Appropriate and Good  Engagement in Group:  Engaged  Modes of Intervention:  Discussion and Support  Additional Comments:  N/A  Brandon Owen 01/18/2024, 9:53 PM
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
## 2024-01-18 NOTE — ED Notes (Signed)
 Security called for escort to BMU.

## 2024-01-18 NOTE — Progress Notes (Signed)
 SPIRITUAL CARE AND COUNSELING CONSULT NOTE   VISIT SUMMARY Chaplain responded to [place] [facility] asking for a Quran.  Chaplain left Quran on the patient's bed while patient was at dinner.  Chaplain saw patient and staff and let them know.  Patient had no other needs from Chaplain when asked.     SPIRITUAL ENCOUNTER                                                                                                                                                                      Type of Visit: Initial Care provided to:: Patient Conversation partners present during encounter: Nurse Referral source: Patient request Reason for visit: Routine spiritual support OnCall Visit: Yes   SPIRITUAL FRAMEWORK      GOALS       INTERVENTIONS        INTERVENTION OUTCOMES      SPIRITUAL CARE PLAN        If immediate needs arise, please contact [facility] 24 hour on call [phone].   Hart Moats, Chaplain  01/18/2024 5:02 PM

## 2024-01-19 NOTE — Progress Notes (Addendum)
 Patient ID: Brandon Owen, male   DOB: 01-28-2000, 24 y.o.   MRN: [id number] [facility] MD Progress Note  01/19/2024 11:10 AM Nassir Neidert  MRN:  [id number]   Patient is a 24 year old male who presented to ED for self-inflicted laceration to the left forearm in the context of suicidal ideation and chronic self-injurious behavior. 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 Patient was admitted to the inpatient psychiatric unit for further stabilization and treatment.  Subjective:  Chart reviewed, case discussed in multidisciplinary meeting, patient seen during rounds.   01/19/2024: Patient was seen in his room resting this morning during psychiatric rounds. Patient is alert and oriented X 4, calm, cooperative, and engaged well in conversation. Patient reports his medications are making him sleepy, despite him sleeping during the night. He endorses ongoing depression, which he rates his depression a 4 out of 10 this morning and denies having any anxiety. He denies any withdrawal symptoms and has no observation of acute concerns. Patient relates having a good appetite. Patient has clear, linear thoughts, judgment and insight are intact. He denies having current suicidal or homicidal ideations or perceptual disturbances today. Will continue to monitor.   Past Psychiatric History: see h&P Family History:  Family History  Problem Relation Age of Onset   GER disease Brother    Social History:  Social History   Substance and Sexual Activity  Alcohol Use Yes     Social History   Substance and Sexual Activity  Drug Use Yes   Types: Marijuana    Social History   Socioeconomic History   Marital status: Single    Spouse name: Not on file   Number of children: Not on file   Years of education: Not on file   Highest education level: Not on file  Occupational History   Not on file  Tobacco Use   Smoking status:  Every Day    Types: Cigars    Passive exposure: Current   Smokeless tobacco: Never  Substance and Sexual Activity   Alcohol use: Yes   Drug use: Yes    Types: Marijuana   Sexual activity: Not on file  Other Topics Concern   Not on file  Social History Narrative   Not on file   Social Drivers of Health   Tobacco Use: High Risk (01/17/2024)   Patient History    Smoking Tobacco Use: Every Day    Smokeless Tobacco Use: Never    Passive Exposure: Current  Financial Resource Strain: Not on file  Food Insecurity: No Food Insecurity (01/18/2024)   Epic    Worried About Programme Researcher, Broadcasting/film/video in the Last Year: Never true    Ran Out of Food in the Last Year: Never true  Transportation Needs: No Transportation Needs (01/18/2024)   Epic    Lack of Transportation (Medical): No    Lack of Transportation (Non-Medical): No  Physical Activity: Not on file  Stress: Not on file  Social Connections: Not on file  Depression (EYV7-0): Low Risk (04/01/2023)   Depression (PHQ2-9)    PHQ-2 Score: 0  Alcohol Screen: Not on file  Housing: High Risk (01/18/2024)   Epic    Unable to Pay for Housing in the Last Year: Yes    Number of Times Moved in the Last Year: 1    Homeless in the Last Year: Yes  Utilities: Not At Risk (01/18/2024)   Epic    Threatened with loss of  utilities: No  Health Literacy: Not on file   Past Medical History: No past medical history on file.  Past Surgical History:  Procedure Laterality Date   KNEE ARTHROSCOPY WITH ANTERIOR CRUCIATE LIGAMENT (ACL) REPAIR Left     Current Medications: Current Facility-Administered Medications  Medication Dose Route Frequency Provider Last Rate Last Admin   acetaminophen  (TYLENOL ) tablet 650 mg  650 mg Oral Q6H PRN McLauchlin, Angela, NP   650 mg at 01/18/24 1736   alum & mag hydroxide-simeth (MAALOX/MYLANTA) 200-200-20 MG/5ML suspension 30 mL  30 mL Oral Q4H PRN McLauchlin, Angela, NP       haloperidol  (HALDOL ) tablet 5 mg  5 mg Oral TID  PRN McLauchlin, Angela, NP       And   diphenhydrAMINE  (BENADRYL ) capsule 50 mg  50 mg Oral TID PRN McLauchlin, Angela, NP       haloperidol  lactate (HALDOL ) injection 5 mg  5 mg Intramuscular TID PRN McLauchlin, Angela, NP       And   diphenhydrAMINE  (BENADRYL ) injection 50 mg  50 mg Intramuscular TID PRN McLauchlin, Angela, NP       And   LORazepam  (ATIVAN ) injection 2 mg  2 mg Intramuscular TID PRN McLauchlin, Angela, NP       haloperidol  lactate (HALDOL ) injection 10 mg  10 mg Intramuscular TID PRN McLauchlin, Angela, NP       And   diphenhydrAMINE  (BENADRYL ) injection 50 mg  50 mg Intramuscular TID PRN McLauchlin, Angela, NP       And   LORazepam  (ATIVAN ) injection 2 mg  2 mg Intramuscular TID PRN McLauchlin, Angela, NP       FLUoxetine  (PROZAC ) capsule 10 mg  10 mg Oral Daily McLauchlin, Angela, NP   10 mg at 01/19/24 0853   folic acid  (FOLVITE ) tablet 1 mg  1 mg Oral Daily Smith, Annie B, NP   1 mg at 01/19/24 9143   gabapentin  (NEURONTIN ) capsule 100 mg  100 mg Oral TID McLauchlin, Angela, NP   100 mg at 01/19/24 0854   hydrOXYzine  (ATARAX ) tablet 25 mg  25 mg Oral TID PRN McLauchlin, Angela, NP   25 mg at 01/18/24 0701   LORazepam  (ATIVAN ) tablet 0-4 mg  0-4 mg Oral Q6H Smith, Annie B, NP   2 mg at 01/19/24 0500   Followed by   NOREEN ON 01/20/2024] LORazepam  (ATIVAN ) tablet 0-4 mg  0-4 mg Oral Q12H Smith, Annie B, NP       magnesium  hydroxide (MILK OF MAGNESIA) suspension 30 mL  30 mL Oral Daily PRN McLauchlin, Angela, NP       multivitamin with minerals tablet 1 tablet  1 tablet Oral Daily Smith, Annie B, NP   1 tablet at 01/19/24 9146   nicotine  polacrilex (NICORETTE ) gum 2 mg  2 mg Oral PRN Smith, Annie B, NP   2 mg at 01/18/24 1736   thiamine  (VITAMIN B1) tablet 100 mg  100 mg Oral Daily Smith, Annie B, NP   100 mg at 01/19/24 9145   Or   thiamine  (VITAMIN B1) injection 100 mg  100 mg Intravenous Daily Smith, Annie B, NP       traZODone  (DESYREL ) tablet 50 mg  50 mg Oral QHS  PRN McLauchlin, Angela, NP   50 mg at 01/18/24 2127    Lab Results:  Results for orders placed or performed during the hospital encounter of 01/17/24 (from the past 48 hours)  Comprehensive metabolic panel     Status: Abnormal  Collection Time: 01/17/24 10:01 PM  Result Value Ref Range   Sodium 140 135 - 145 mmol/L   Potassium 3.6 3.5 - 5.1 mmol/L   Chloride 104 98 - 111 mmol/L   CO2 21 (L) 22 - 32 mmol/L   Glucose, Bld 95 70 - 99 mg/dL    Comment: Glucose reference range applies only to samples taken after fasting for at least 8 hours.   BUN 8 6 - 20 mg/dL   Creatinine, Ser 8.76 0.61 - 1.24 mg/dL   Calcium 9.0 8.9 - 89.6 mg/dL   Total Protein 7.7 6.5 - 8.1 g/dL   Albumin 4.7 3.5 - 5.0 g/dL   AST 24 15 - 41 U/L    Comment: HEMOLYSIS AT THIS LEVEL MAY AFFECT RESULT   ALT 32 0 - 44 U/L   Alkaline Phosphatase 87 38 - 126 U/L   Total Bilirubin 0.5 0.0 - 1.2 mg/dL   GFR, Estimated >39 >39 mL/min    Comment: (NOTE) Calculated using the CKD-EPI Creatinine Equation (2021)    Anion gap 15 5 - 15    Comment: Performed at [facility], [address] [place], [place] [address]  Ethanol     Status: None   Collection Time: 01/17/24 10:01 PM  Result Value Ref Range   Alcohol, Ethyl (B) <15 <15 mg/dL    Comment: (NOTE) For medical purposes only. Performed at [facility], [address] [place], [place] [address]   cbc     Status: None   Collection Time: 01/17/24 10:01 PM  Result Value Ref Range   WBC 8.9 4.0 - 10.5 K/uL   RBC 5.23 4.22 - 5.81 MIL/uL   Hemoglobin 15.6 13.0 - 17.0 g/dL   HCT 55.4 60.9 - 47.9 %   MCV 85.1 80.0 - 100.0 fL   MCH 29.8 26.0 - 34.0 pg   MCHC 35.1 30.0 - 36.0 g/dL   RDW 87.5 88.4 - 84.4 %   Platelets 322 150 - 400 K/uL   nRBC 0.0 0.0 - 0.2 %    Comment: Performed at [facility], [address], [place], [place] [address]  Urine Drug Screen     Status: Abnormal   Collection Time: 01/18/24  4:40 AM  Result Value Ref  Range   Opiates NEGATIVE NEGATIVE   Cocaine NEGATIVE NEGATIVE   Benzodiazepines NEGATIVE NEGATIVE   Amphetamines NEGATIVE NEGATIVE   Tetrahydrocannabinol POSITIVE (A) NEGATIVE   Barbiturates NEGATIVE NEGATIVE   Methadone Scn, Ur NEGATIVE NEGATIVE   Fentanyl NEGATIVE NEGATIVE    Comment: (NOTE) Drug screen is for Medical Purposes only. Positive results are preliminary only. If confirmation is needed, notify lab within 5 days.  Drug Class                 Cutoff (ng/mL) Amphetamine and metabolites 1000 Barbiturate and metabolites 200 Benzodiazepine              200 Opiates and metabolites     300 Cocaine and metabolites     300 THC                         50 Fentanyl                    5 Methadone                   300  Trazodone  is metabolized in vivo to several metabolites,  including pharmacologically  active m-CPP, which is excreted in the  urine.  Immunoassay screens for amphetamines and MDMA have potential  cross-reactivity with these compounds and may provide false positive  result.  Performed at [facility], [address] [place], [place] [address]     Blood Alcohol level:  Lab Results  Component Value Date   [facility] <15 01/17/2024    Metabolic Disorder Labs: No results found for: HGBA1C, MPG No results found for: PROLACTIN No results found for: CHOL, TRIG, HDL, CHOLHDL, VLDL, LDLCALC  Physical Findings: AIMS:  , ,  ,  ,    CIWA:  CIWA-Ar Total: 0 COWS:      Psychiatric Specialty Exam:  Presentation  General Appearance:  Appropriate for Environment  Eye Contact: Fair  Speech: Clear and Coherent  Speech Volume: Normal    Mood and Affect  Mood: Depressed  Affect: Congruent   Thought Process  Thought Processes: Coherent; Linear  Orientation:Full (Time, Place and Person)  Thought Content:Logical  Hallucinations:No data recorded Ideas of Reference:No data recorded Suicidal Thoughts:Suicidal Thoughts:  No  Homicidal Thoughts:Homicidal Thoughts: No   Sensorium  Memory: Immediate Fair; Remote Fair; Recent Fair  Judgment: Fair  Insight: Present   Executive Functions  Concentration: Fair  Attention Span: Fair  Recall: [employer] of Knowledge: Fair  Language: Fair   Psychomotor Activity  Psychomotor Activity: Psychomotor Activity: Normal  Musculoskeletal: Strength & Muscle Tone: within normal limits Gait & Station: normal Assets  Assets: Manufacturing Systems Engineer; Desire for Improvement    Physical Exam: Physical Exam ROS Blood pressure 120/75, pulse 70, temperature 98 F (36.7 C), temperature source Oral, resp. rate 16, height 6' 2 (1.88 m), weight 102.1 kg, SpO2 99%. Body mass index is 28.89 kg/m.  Diagnosis: Principal Problem:   MDD (major depressive disorder), recurrent episode, severe (HCC)   PLAN: Safety and Monitoring:  -- Voluntary admission to inpatient psychiatric unit for safety, stabilization and treatment  -- Daily contact with patient to assess and evaluate symptoms and progress in treatment  -- Patient's case to be discussed in multi-disciplinary team meeting  -- Observation Level : q15 minute checks  -- Vital signs:  q12 hours  -- Precautions: suicide, elopement, and assault -- Encouraged patient to participate in unit milieu and in scheduled group therapies  2. Psychiatric Treatment:  Scheduled Medications: -Continue CIWA protocol due to alcohol intake information reported to this provider today during assessment - Continue fluoxetine  10 mg daily to target depressive symptoms - Continue gabapentin  100 mg 3 times daily to target chronic pain/anxiety symptoms as well as alcohol use disorder  Hydroxyzine  25 mg 3 times daily as needed for anxiety - Trazodone  50 mg daily as needed at bedtime for sleep             -- The risks/benefits/side-effects/alternatives to this medication were discussed in detail with the patient and time was given  for questions. The patient consents to medication trial.  3. Medical Issues Being Addressed:     4. Discharge Planning:   -- Social work and case management to assist with discharge planning and identification of hospital follow-up needs prior to discharge  -- Estimated LOS: 5-7 days             -- Discharge Concerns: Need to establish a safety plan; Medication compliance and effectiveness             -- Discharge Goals: Return home with outpatient referrals follow ups   Camelia JINNY Mountain, NP 01/19/2024, 11:10 AM

## 2024-01-19 NOTE — Plan of Care (Signed)
" °  Problem: Education: Goal: Emotional status will improve 01/19/2024 2214 by Zachary Titus BIRCH, RN Outcome: Progressing 01/19/2024 1648 by Zachary Titus BIRCH, RN Outcome: Progressing Goal: Mental status will improve 01/19/2024 2214 by Zachary Titus BIRCH, RN Outcome: Progressing 01/19/2024 1648 by Zachary Titus BIRCH, RN Outcome: Progressing   "

## 2024-01-19 NOTE — Group Note (Signed)
 Date:  01/19/2024 Time:  11:13 AM  Group Topic/Focus:  Goals Group:   The focus of this group is to help patients establish daily goals to achieve during treatment and discuss how the patient can incorporate goal setting into their daily lives to aide in recovery.    Participation Level:  Did Not Attend   Doyne Micke L Kenitha Glendinning 01/19/2024, 11:13 AM

## 2024-01-19 NOTE — Plan of Care (Signed)
  Problem: Education: Goal: Emotional status will improve Outcome: Progressing   Problem: Education: Goal: Knowledge of [facility] Education information/materials will improve Outcome: Progressing

## 2024-01-19 NOTE — Group Note (Signed)
 Date:  01/19/2024 Time:  9:14 PM  Group Topic/Focus:  Wrap-Up Group:   The focus of this group is to help patients review their daily goal of treatment and discuss progress on daily workbooks.    Participation Level:  Active  Participation Quality:  Appropriate, Attentive, Sharing, and Supportive  Affect:  Appropriate  Cognitive:  Appropriate  Insight: Appropriate  Engagement in Group:  Engaged  Modes of Intervention:  Discussion  Additional Comments:     Kerri Katz 01/19/2024, 9:14 PM

## 2024-01-19 NOTE — Plan of Care (Signed)
" °  Problem: Education: Goal: Knowledge of [facility] Education information/materials will improve 01/19/2024 0701 by Scarlet Jannett Humbles, RN Outcome: Progressing   Problem: Education: Goal: Emotional status will improve 01/19/2024 0701 by Scarlet Jannett Humbles, RN Outcome: Progressing 01/19/2024 0700 by Scarlet Jannett Humbles, RN Outcome: Progressing   Problem: Education: Goal: Mental status will improve 01/19/2024 0701 by Scarlet Jannett Humbles, RN Outcome: Progressing 01/19/2024 0700 by Scarlet Jannett Humbles, RN Outcome: Progressing   "

## 2024-01-19 NOTE — Plan of Care (Signed)
   Problem: Education: Goal: Emotional status will improve Outcome: Progressing Goal: Mental status will improve Outcome: Progressing

## 2024-01-19 NOTE — Progress Notes (Signed)
" °   01/19/24 2105  Psychosocial Assessment  Patient Complaints Anxiety  Eye Contact Fair  Facial Expression Flat  Affect Appropriate to circumstance  Speech Logical/coherent  Interaction Assertive  Motor Activity Other (Comment) (WNL)  Appearance/Hygiene In scrubs  Behavior Characteristics Cooperative  Mood Depressed  Thought Process  Coherency WDL  Content WDL  Delusions None reported or observed  Perception WDL  Hallucination None reported or observed  Judgment Impaired  Confusion None  Danger to Self  Current suicidal ideation? Denies  Agreement Not to Harm Self Yes  Danger to Others  Danger to Others None reported or observed    "

## 2024-01-19 NOTE — Group Note (Signed)
 [facility] LCSW Group Therapy Note   Group Date: 01/19/2024 Start Time: 1400 End Time: 1500   Type of Therapy/Topic:  Group Therapy:  Emotion Regulation  Participation Level:  Did Not Attend   Mood: Not able to assess  Description of Group:    The purpose of this group is to assist patients in learning to regulate negative emotions and experience positive emotions. Patients will be guided to discuss ways in which they have been vulnerable to their negative emotions. These vulnerabilities will be juxtaposed with experiences of positive emotions or situations, and patients challenged to use positive emotions to combat negative ones. Special emphasis will be placed on coping with negative emotions in conflict situations, and patients will process healthy conflict resolution skills.  Therapeutic Goals: Patient will identify two positive emotions or experiences to reflect on in order to balance out negative emotions:  Patient will label two or more emotions that they find the most difficult to experience:  Patient will be able to demonstrate positive conflict resolution skills through discussion or role plays:   Summary of Patient Progress: Not able to assess.  Pt  did not attend group.   Therapeutic Modalities:   Cognitive Behavioral Therapy Feelings Identification Dialectical Behavioral Therapy   Rexene LELON Mae, LCSWA

## 2024-01-19 NOTE — [facility] Suicide Risk Assessment (Signed)
 [facility] INPATIENT:  Family/Significant Other Suicide Prevention Education  Suicide Prevention Education:  Education Completed; Kiki Mu, mother, [phone]  (name of family member/significant other) has been identified by the patient as the family member/significant other with whom the patient will be residing, and identified as the person(s) who will aid the patient in the event of a mental health crisis (suicidal ideations/suicide attempt).  With written consent from the patient, the family member/significant other has been provided the following suicide prevention education, prior to the and/or following the discharge of the patient.  The suicide prevention education provided includes the following: Suicide risk factors Suicide prevention and interventions National Suicide Hotline telephone number [facility] assessment telephone number [facility] Emergency Assistance 911 [facility] and/or Residential Mobile Crisis Unit telephone number  Request made of family/significant other to: Remove weapons (e.g., guns, rifles, knives), all items previously/currently identified as safety concern.   Remove drugs/medications (over-the-counter, prescriptions, illicit drugs), all items previously/currently identified as a safety concern.  The family member/significant other verbalizes understanding of the suicide prevention education information provided.  The family member/significant other agrees to remove the items of safety concern listed above.  Pt's mother reports that pt was living in [place] but he had to leave, reports that something happened when pt was in [place] but reports pt is not being forward with her about what happened.Pt's mother reports pt has currently been staying with a girl in [place], [place]. Reports that pt has a brother that has place where he can stay at but reports he and his brother got into a bad argument, reports that pt's brother said he could  come but they need to talk first and that pt needs to apologize.   Reports pt has access to razor blades, unsure about guns.   Pt's mother reports she is unsure what brought him into the hospital, stating, I just don't know what's going on with him, he just won't talk to me. He says no one cares about him, no one understand him they make me out to be the bad person. His thing with me is that he things I'm disappointed in him and that he's a burden.   Reports pt has been living the party life and drinking life, reports that it has slowed down and now everything is irritating him   I don't know what's wrong with him so I can't really say. I told him you need to work on getting yourself together and better, because he has two kids  Lum JONETTA Croft 01/19/2024, 2:09 PM

## 2024-01-20 ENCOUNTER — Encounter: Payer: Self-pay | Admitting: Psychiatry

## 2024-01-20 MED ORDER — FLUOXETINE HCL 20 MG PO CAPS
20.0000 mg | ORAL_CAPSULE | Freq: Every day | ORAL | Status: DC
  Administered 2024-01-21 – 2024-01-23 (×3): 20 mg via ORAL
  Filled 2024-01-20 (×3): qty 1

## 2024-01-20 MED ORDER — NICOTINE 14 MG/24HR TD PT24
14.0000 mg | MEDICATED_PATCH | Freq: Every day | TRANSDERMAL | Status: DC
  Administered 2024-01-20: 14 mg via TRANSDERMAL
  Filled 2024-01-20: qty 1

## 2024-01-20 MED ORDER — IBUPROFEN 600 MG PO TABS: 600.0000 mg | ORAL_TABLET | Freq: Three times a day (TID) | ORAL | Status: DC | PRN

## 2024-01-20 NOTE — Group Note (Signed)
 Date:  01/20/2024 Time:  10:58 AM  Group Topic/Focus:  Managing Feelings:   The focus of this group is to identify what feelings patients have difficulty handling and develop a plan to handle them in a healthier way upon discharge.    Participation Level:  Active  Participation Quality:  Appropriate  Affect:  Appropriate  Cognitive:  Appropriate  Insight: Appropriate  Engagement in Group:  Engaged  Modes of Intervention:  Activity  Additional Comments:    Deryl Giroux 01/20/2024, 10:58 AM

## 2024-01-20 NOTE — Plan of Care (Signed)

## 2024-01-20 NOTE — Group Note (Signed)
 Recreation Therapy Group Note   Group Topic:Coping Skills  Group Date: 01/20/2024 Start Time: 1530 End Time: 1615 Facilitators: Celestia Jeoffrey FORBES ARTICE, CTRS Location: Craft Room  Group Description: Coping A-Z. LRT and patients engage in a guided discussion on what coping skills are and gave specific examples. LRT passed out a handout labeled Coping A-Z with blank spaces beside each letter. LRT prompted patients to come up with a coping skill for each of the letters. LRT and patients went over the handout and gave ideas for each letter if anyone had any blanks left on their paper. Patients kept this handout with them that listed 26 different coping skills.   Goal Area(s) Addressed: Patients will be able to define coping skills. Patient will identify new coping skills.  Patient will increase communication.   Affect/Mood: Appropriate   Participation Level: Active and Engaged   Participation Quality: Independent   Behavior: Calm and Cooperative   Speech/Thought Process: Coherent   Insight: Good   Judgement: Good   Modes of Intervention: Clarification, Education, Worksheet, and Writing   Patient Response to Interventions:  Attentive, Engaged, Interested , and Receptive   Education Outcome:  Acknowledges education   Clinical Observations/Individualized Feedback: Seibert was active in their participation of session activities and group discussion. Pt interacted well with LRT and peers duration of session.    Plan: Continue to engage patient in RT group sessions 2-3x/week.   Jeoffrey FORBES Celestia, LRT, CTRS 01/20/2024 5:24 PM

## 2024-01-20 NOTE — BH IP Treatment Plan (Signed)
 Interdisciplinary Treatment and Diagnostic Plan Update  01/20/2024 Time of Session: 10:17 AM Brandon Owen MRN: [id number]  Principal Diagnosis: MDD (major depressive disorder), recurrent episode, severe (HCC)  Secondary Diagnoses: Principal Problem:   MDD (major depressive disorder), recurrent episode, severe (HCC)   Current Medications:  Current Facility-Administered Medications  Medication Dose Route Frequency Provider Last Rate Last Admin   acetaminophen  (TYLENOL ) tablet 650 mg  650 mg Oral Q6H PRN McLauchlin, Angela, NP   650 mg at 01/20/24 0832   alum & mag hydroxide-simeth (MAALOX/MYLANTA) 200-200-20 MG/5ML suspension 30 mL  30 mL Oral Q4H PRN McLauchlin, Angela, NP       haloperidol  (HALDOL ) tablet 5 mg  5 mg Oral TID PRN McLauchlin, Angela, NP       And   diphenhydrAMINE  (BENADRYL ) capsule 50 mg  50 mg Oral TID PRN McLauchlin, Angela, NP       haloperidol  lactate (HALDOL ) injection 5 mg  5 mg Intramuscular TID PRN McLauchlin, Angela, NP       And   diphenhydrAMINE  (BENADRYL ) injection 50 mg  50 mg Intramuscular TID PRN McLauchlin, Angela, NP       And   LORazepam  (ATIVAN ) injection 2 mg  2 mg Intramuscular TID PRN McLauchlin, Angela, NP       haloperidol  lactate (HALDOL ) injection 10 mg  10 mg Intramuscular TID PRN McLauchlin, Angela, NP       And   diphenhydrAMINE  (BENADRYL ) injection 50 mg  50 mg Intramuscular TID PRN McLauchlin, Angela, NP       And   LORazepam  (ATIVAN ) injection 2 mg  2 mg Intramuscular TID PRN McLauchlin, Angela, NP       FLUoxetine  (PROZAC ) capsule 10 mg  10 mg Oral Daily McLauchlin, Angela, NP   10 mg at 01/20/24 9170   folic acid  (FOLVITE ) tablet 1 mg  1 mg Oral Daily Smith, Annie B, NP   1 mg at 01/20/24 9170   gabapentin  (NEURONTIN ) capsule 100 mg  100 mg Oral TID McLauchlin, Angela, NP   100 mg at 01/20/24 1306   hydrOXYzine  (ATARAX ) tablet 25 mg  25 mg Oral TID PRN McLauchlin, Angela, NP   25 mg at 01/19/24 2105   ibuprofen  (ADVIL ) tablet 600 mg   600 mg Oral Q8H PRN Smith, Annie B, NP       LORazepam  (ATIVAN ) tablet 0-4 mg  0-4 mg Oral Q6H Smith, Annie B, NP   2 mg at 01/20/24 1308   Followed by   LORazepam  (ATIVAN ) tablet 0-4 mg  0-4 mg Oral Q12H Smith, Annie B, NP       magnesium  hydroxide (MILK OF MAGNESIA) suspension 30 mL  30 mL Oral Daily PRN McLauchlin, Angela, NP       multivitamin with minerals tablet 1 tablet  1 tablet Oral Daily Smith, Annie B, NP   1 tablet at 01/20/24 9170   nicotine  polacrilex (NICORETTE ) gum 2 mg  2 mg Oral PRN Smith, Annie B, NP   2 mg at 01/18/24 1736   thiamine  (VITAMIN B1) tablet 100 mg  100 mg Oral Daily Smith, Annie B, NP   100 mg at 01/20/24 9170   Or   thiamine  (VITAMIN B1) injection 100 mg  100 mg Intravenous Daily Smith, Annie B, NP       traZODone  (DESYREL ) tablet 50 mg  50 mg Oral QHS PRN McLauchlin, Angela, NP   50 mg at 01/19/24 2105   PTA Medications: Medications Prior to Admission  Medication Sig Dispense  Refill Last Dose/Taking   gabapentin  (NEURONTIN ) 100 MG capsule Take 1 capsule (100 mg total) by mouth 3 (three) times daily. 90 capsule 3     Patient Stressors: Financial difficulties    Patient Strengths: Religious Affiliation  Supportive family/friends  Work skills   Treatment Modalities: Medication Management, Group therapy, Case management,  1 to 1 session with clinician, Psychoeducation, Recreational therapy.   Physician Treatment Plan for Primary Diagnosis: MDD (major depressive disorder), recurrent episode, severe (HCC) Long Term Goal(s): Improvement in symptoms so as ready for discharge   Short Term Goals: Ability to identify changes in lifestyle to reduce recurrence of condition will improve Ability to verbalize feelings will improve Ability to disclose and discuss suicidal ideas Ability to demonstrate self-control will improve Ability to identify and develop effective coping behaviors will improve Ability to maintain clinical measurements within normal limits will  improve Compliance with prescribed medications will improve Ability to identify triggers associated with substance abuse/mental health issues will improve  Medication Management: Evaluate patient's response, side effects, and tolerance of medication regimen.  Therapeutic Interventions: 1 to 1 sessions, Unit Group sessions and Medication administration.  Evaluation of Outcomes: Not Met  Physician Treatment Plan for Secondary Diagnosis: Principal Problem:   MDD (major depressive disorder), recurrent episode, severe (HCC)  Long Term Goal(s): Improvement in symptoms so as ready for discharge   Short Term Goals: Ability to identify changes in lifestyle to reduce recurrence of condition will improve Ability to verbalize feelings will improve Ability to disclose and discuss suicidal ideas Ability to demonstrate self-control will improve Ability to identify and develop effective coping behaviors will improve Ability to maintain clinical measurements within normal limits will improve Compliance with prescribed medications will improve Ability to identify triggers associated with substance abuse/mental health issues will improve     Medication Management: Evaluate patient's response, side effects, and tolerance of medication regimen.  Therapeutic Interventions: 1 to 1 sessions, Unit Group sessions and Medication administration.  Evaluation of Outcomes: Not Met   RN Treatment Plan for Primary Diagnosis: MDD (major depressive disorder), recurrent episode, severe (HCC) Long Term Goal(s): Knowledge of disease and therapeutic regimen to maintain health will improve  Short Term Goals: Ability to verbalize frustration and anger appropriately will improve, Ability to demonstrate self-control, Ability to participate in decision making will improve, Ability to verbalize feelings will improve, Ability to disclose and discuss suicidal ideas, and Ability to identify and develop effective coping behaviors  will improve  Medication Management: RN will administer medications as ordered by provider, will assess and evaluate patient's response and provide education to patient for prescribed medication. RN will report any adverse and/or side effects to prescribing provider.  Therapeutic Interventions: 1 on 1 counseling sessions, Psychoeducation, Medication administration, Evaluate responses to treatment, Monitor vital signs and CBGs as ordered, Perform/monitor CIWA, COWS, AIMS and Fall Risk screenings as ordered, Perform wound care treatments as ordered.  Evaluation of Outcomes: Not Met   LCSW Treatment Plan for Primary Diagnosis: MDD (major depressive disorder), recurrent episode, severe (HCC) Long Term Goal(s): Safe transition to appropriate next level of care at discharge, Engage patient in therapeutic group addressing interpersonal concerns.  Short Term Goals: Engage patient in aftercare planning with referrals and resources, Increase social support, Increase ability to appropriately verbalize feelings, Increase emotional regulation, Facilitate acceptance of mental health diagnosis and concerns, Facilitate patient progression through stages of change regarding substance use diagnoses and concerns, Identify triggers associated with mental health/substance abuse issues, and Increase skills for wellness and recovery  Therapeutic Interventions: Assess for all discharge needs, 1 to 1 time with Child psychotherapist, Explore available resources and support systems, Assess for adequacy in community support network, Educate family and significant other(s) on suicide prevention, Complete Psychosocial Assessment, Interpersonal group therapy.  Evaluation of Outcomes: Not Met   Progress in Treatment: Attending groups: Yes. and No. Participating in groups: Yes. and No. Taking medication as prescribed: Yes. Toleration medication: Yes. Family/Significant other contact made: Yes, individual(s) contacted:  Kiki Cowden, mom, [phone] Patient understands diagnosis: Yes. Discussing patient identified problems/goals with staff: Yes. Medical problems stabilized or resolved: Yes. Denies suicidal/homicidal ideation: Yes. Issues/concerns per patient self-inventory: No. Other: None  New problem(s) identified: No, Describe:  None  New Short Term/Long Term Goal(s):detox, elimination of symptoms of psychosis, medication management for mood stabilization; elimination of SI thoughts; development of comprehensive mental wellness/sobriety plan.    Patient Goals:  Just to get some therapy or someone to talk to.  Discharge Plan or Barriers:   Reason for Continuation of Hospitalization: Delusions  Depression Suicidal ideation  Estimated Length of Stay: 1-7 days.   Last 3 [place] Suicide Severity Risk Score: Flowsheet Row Admission (Current) from 01/18/2024 in [facility]INE ED from 01/17/2024 in [facility] Emergency Department at [facility] ED from 10/25/2022 in [facility] Emergency Department at [facility]  C-SSRS RISK CATEGORY Error: Question 2 not populated High Risk No Risk    Last PHQ 2/9 Scores:    04/01/2023   10:55 AM  Depression screen PHQ 2/9  Decreased Interest 0  Down, Depressed, Hopeless 0  PHQ - 2 Score 0  Altered sleeping 0  Tired, decreased energy 0  Change in appetite 0  Feeling bad or failure about yourself  0  Trouble concentrating 0  Moving slowly or fidgety/restless 0  Suicidal thoughts 0  PHQ-9 Score 0   Difficult doing work/chores Not difficult at all     Data saved with a previous flowsheet row definition    Scribe for Treatment Team: Candace Ramus M Serafin [place], KEN 01/20/2024 3:22 PM

## 2024-01-20 NOTE — Group Note (Signed)
 Recreation Therapy Group Note   Group Topic:Healthy Support Systems  Group Date: 01/20/2024 Start Time: 1020 End Time: 1115 Facilitators: Celestia Jeoffrey FORBES ARTICE, CTRS Location: Craft Room  Group Description: Straw Bridge. In groups or individually, patients were given 10 plastic drinking straws and an equal length of masking tape. Using the materials provided, patients were instructed to build a free-standing bridge-like structure to suspend an everyday item (ex: deck of cards) off the floor or table surface. All materials were required to be used in secondary school teacher. LRT facilitated post-activity discussion reviewing the importance of having strong and healthy support systems in our lives. LRT discussed how the people in our lives serve as the tape and the deck of cards we placed on top of our straw structure are the stressors we face in daily life. LRT and pts discussed what happens in our life when things get too heavy for us , and we don't have strong supports outside of the hospital. Pt shared 2 of their healthy supports in their life aloud in the group.   Goal Area(s) Addressed:  Patient will identify 2 healthy supports in their life. Patient will identify skills to successfully complete activity. Patient will identify correlation of this activity to life post-discharge.  Patient will build on frustration tolerance skills. Patient will increase team building and communication skills.    Affect/Mood: Appropriate   Participation Level: Active and Engaged   Participation Quality: Independent   Behavior: Calm and Cooperative   Speech/Thought Process: Coherent   Insight: Good   Judgement: Good   Modes of Intervention: STEM Activity   Patient Response to Interventions:  Attentive, Engaged, Interested , and Receptive   Education Outcome:  Acknowledges education   Clinical Observations/Individualized Feedback: Brandon Owen was active in their participation of session activities and group  discussion. Pt identified talk with my kids and my mom as healthy supports.    Plan: Continue to engage patient in RT group sessions 2-3x/week.   Jeoffrey FORBES Celestia, LRT, CTRS 01/20/2024 12:00 PM

## 2024-01-20 NOTE — Progress Notes (Signed)
 Patient ID: Brandon Owen, male   DOB: 08-06-2000, 24 y.o.   MRN: [id number] [facility] MD Progress Note  01/20/2024 12:39 PM Brandon Owen  MRN:  [id number]   Patient is a 24 year old male who presented to ED for self-inflicted laceration to the left forearm in the context of suicidal ideation and chronic self-injurious behavior. 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 Patient was admitted to the inpatient psychiatric unit for further stabilization and treatment.  Subjective:  Chart reviewed, case discussed in multidisciplinary meeting, patient seen during rounds.   01/20/24: Patient is noted to be resting in bed.  He did acknowledge that current admission is in the context of SI and self-harm behaviors of cutting.  He denies any current active SI or urges of self harming behaviors.  He denies auditory/visual hallucinations.  He rates anxiety 4 out of 10, 10 being the worst depression 4 out of 10.  Patient is agreeable to increase dose of Prozac  to 20 mg to optimize the response to antidepressant.  He is encouraged to participate.  Groups and engage with social worker and discharge planning.  Patient reports having a place to go back home  01/19/2024: Patient was seen in his room resting this morning during psychiatric rounds. Patient is alert and oriented X 4, calm, cooperative, and engaged well in conversation. Patient reports his medications are making him sleepy, despite him sleeping during the night. He endorses ongoing depression, which he rates his depression a 4 out of 10 this morning and denies having any anxiety. He denies any withdrawal symptoms and has no observation of acute concerns. Patient relates having a good appetite. Patient has clear, linear thoughts, judgment and insight are intact. He denies having current suicidal or homicidal ideations or perceptual disturbances today. Will continue to monitor.    Past Psychiatric History: see h&P Family History:  Family History  Problem Relation Age of Onset   GER disease Brother    Social History:  Social History   Substance and Sexual Activity  Alcohol Use Yes     Social History   Substance and Sexual Activity  Drug Use Yes   Types: Marijuana    Social History   Socioeconomic History   Marital status: Single    Spouse name: Not on file   Number of children: Not on file   Years of education: Not on file   Highest education level: Not on file  Occupational History   Not on file  Tobacco Use   Smoking status: Every Day    Types: Cigars    Passive exposure: Current   Smokeless tobacco: Never  Substance and Sexual Activity   Alcohol use: Yes   Drug use: Yes    Types: Marijuana   Sexual activity: Not on file  Other Topics Concern   Not on file  Social History Narrative   Not on file   Social Drivers of Health   Tobacco Use: High Risk (01/17/2024)   Patient History    Smoking Tobacco Use: Every Day    Smokeless Tobacco Use: Never    Passive Exposure: Current  Financial Resource Strain: Not on file  Food Insecurity: No Food Insecurity (01/18/2024)   Epic    Worried About Programme Researcher, Broadcasting/film/video in the Last Year: Never true    Ran Out of Food in the Last Year: Never true  Transportation Needs: No Transportation Needs (01/18/2024)   Epic    Lack of Transportation (  Medical): No    Lack of Transportation (Non-Medical): No  Physical Activity: Not on file  Stress: Not on file  Social Connections: Not on file  Depression (PHQ2-9): Low Risk (04/01/2023)   Depression (PHQ2-9)    PHQ-2 Score: 0  Alcohol Screen: Not on file  Housing: High Risk (01/18/2024)   Epic    Unable to Pay for Housing in the Last Year: Yes    Number of Times Moved in the Last Year: 1    Homeless in the Last Year: Yes  Utilities: Not At Risk (01/18/2024)   Epic    Threatened with loss of utilities: No  Health Literacy: Not on file   Past Medical  History: No past medical history on file.  Past Surgical History:  Procedure Laterality Date   KNEE ARTHROSCOPY WITH ANTERIOR CRUCIATE LIGAMENT (ACL) REPAIR Left     Current Medications: Current Facility-Administered Medications  Medication Dose Route Frequency Provider Last Rate Last Admin   acetaminophen  (TYLENOL ) tablet 650 mg  650 mg Oral Q6H PRN McLauchlin, Angela, NP   650 mg at 01/20/24 0832   alum & mag hydroxide-simeth (MAALOX/MYLANTA) 200-200-20 MG/5ML suspension 30 mL  30 mL Oral Q4H PRN McLauchlin, Angela, NP       haloperidol  (HALDOL ) tablet 5 mg  5 mg Oral TID PRN McLauchlin, Angela, NP       And   diphenhydrAMINE  (BENADRYL ) capsule 50 mg  50 mg Oral TID PRN McLauchlin, Angela, NP       haloperidol  lactate (HALDOL ) injection 5 mg  5 mg Intramuscular TID PRN McLauchlin, Angela, NP       And   diphenhydrAMINE  (BENADRYL ) injection 50 mg  50 mg Intramuscular TID PRN McLauchlin, Angela, NP       And   LORazepam  (ATIVAN ) injection 2 mg  2 mg Intramuscular TID PRN McLauchlin, Angela, NP       haloperidol  lactate (HALDOL ) injection 10 mg  10 mg Intramuscular TID PRN McLauchlin, Angela, NP       And   diphenhydrAMINE  (BENADRYL ) injection 50 mg  50 mg Intramuscular TID PRN McLauchlin, Angela, NP       And   LORazepam  (ATIVAN ) injection 2 mg  2 mg Intramuscular TID PRN McLauchlin, Angela, NP       FLUoxetine  (PROZAC ) capsule 10 mg  10 mg Oral Daily McLauchlin, Angela, NP   10 mg at 01/20/24 0829   folic acid  (FOLVITE ) tablet 1 mg  1 mg Oral Daily Smith, Annie B, NP   1 mg at 01/20/24 9170   gabapentin  (NEURONTIN ) capsule 100 mg  100 mg Oral TID McLauchlin, Angela, NP   100 mg at 01/20/24 9170   hydrOXYzine  (ATARAX ) tablet 25 mg  25 mg Oral TID PRN McLauchlin, Angela, NP   25 mg at 01/19/24 2105   ibuprofen  (ADVIL ) tablet 600 mg  600 mg Oral Q8H PRN Smith, Annie B, NP       LORazepam  (ATIVAN ) tablet 0-4 mg  0-4 mg Oral Q6H Smith, Annie B, NP   2 mg at 01/20/24 0450   Followed by    LORazepam  (ATIVAN ) tablet 0-4 mg  0-4 mg Oral Q12H Smith, Annie B, NP       magnesium  hydroxide (MILK OF MAGNESIA) suspension 30 mL  30 mL Oral Daily PRN McLauchlin, Angela, NP       multivitamin with minerals tablet 1 tablet  1 tablet Oral Daily Smith, Annie B, NP   1 tablet at 01/20/24 9170   nicotine   polacrilex (NICORETTE ) gum 2 mg  2 mg Oral PRN Smith, Annie B, NP   2 mg at 01/18/24 1736   thiamine  (VITAMIN B1) tablet 100 mg  100 mg Oral Daily Smith, Annie B, NP   100 mg at 01/20/24 9170   Or   thiamine  (VITAMIN B1) injection 100 mg  100 mg Intravenous Daily Smith, Annie B, NP       traZODone  (DESYREL ) tablet 50 mg  50 mg Oral QHS PRN McLauchlin, Angela, NP   50 mg at 01/19/24 2105    Lab Results:  No results found for this or any previous visit (from the past 48 hours).   Blood Alcohol level:  Lab Results  Component Value Date   [facility] <15 01/17/2024    Metabolic Disorder Labs: No results found for: HGBA1C, MPG No results found for: PROLACTIN No results found for: CHOL, TRIG, HDL, CHOLHDL, VLDL, LDLCALC  Physical Findings: AIMS:  , ,  ,  ,    CIWA:  CIWA-Ar Total: 0 COWS:      Psychiatric Specialty Exam:  Presentation  General Appearance:  Appropriate for Environment  Eye Contact: Fair  Speech: Clear and Coherent  Speech Volume: Normal    Mood and Affect  Mood: Depressed  Affect: Congruent   Thought Process  Thought Processes: Coherent; Linear  Orientation:Full (Time, Place and Person)  Thought Content:Logical  Hallucinations:denies  Suicidal Thoughts:denies  Homicidal Thoughts:denies   Sensorium  Memory: Immediate Fair; Remote Fair; Recent Fair  Judgment: Fair  Insight: Present   Executive Functions  Concentration: Fair  Attention Span: Fair  Recall: [employer] of Knowledge: Fair  Language: Fair   Psychomotor Activity  Psychomotor Activity: No data recorded  Musculoskeletal: Strength & Muscle  Tone: within normal limits Gait & Station: normal Assets  Assets: Manufacturing Systems Engineer; Desire for Improvement    Physical Exam: Physical Exam ROS Blood pressure 121/76, pulse 66, temperature 97.8 F (36.6 C), temperature source Oral, resp. rate 18, height 6' 2 (1.88 m), weight 102.1 kg, SpO2 99%. Body mass index is 28.89 kg/m.  Diagnosis: Principal Problem:   MDD (major depressive disorder), recurrent episode, severe (HCC)   PLAN: Safety and Monitoring:  -- Involuntary admission to inpatient psychiatric unit for safety, stabilization and treatment  -- Daily contact with patient to assess and evaluate symptoms and progress in treatment  -- Patient's case to be discussed in multi-disciplinary team meeting  -- Observation Level : q15 minute checks  -- Vital signs:  q12 hours  -- Precautions: suicide, elopement, and assault -- Encouraged patient to participate in unit milieu and in scheduled group therapies  2. Psychiatric Treatment:  Scheduled Medications: -Continue CIWA protocol due to alcohol intake information reported to this provider today during assessment - Increase fluoxetine  20 mg daily to target depressive symptoms - Continue gabapentin  100 mg 3 times daily to target chronic pain/anxiety symptoms as well as alcohol use disorder  Hydroxyzine  25 mg 3 times daily as needed for anxiety - Trazodone  50 mg daily as needed at bedtime for sleep             -- The risks/benefits/side-effects/alternatives to this medication were discussed in detail with the patient and time was given for questions. The patient consents to medication trial.  3. Medical Issues Being Addressed:     4. Discharge Planning:   -- Social work and case management to assist with discharge planning and identification of hospital follow-up needs prior to discharge  -- Estimated LOS: 5-7 days             --  Discharge Concerns: Need to establish a safety plan; Medication compliance and effectiveness              -- Discharge Goals: Return home with outpatient referrals follow ups   Allyn Foil, MD 01/20/2024, 12:39 PM

## 2024-01-20 NOTE — Group Note (Signed)
 [facility] LCSW Group Therapy Note   Group Date: 01/20/2024 Start Time: 1245 End Time: 1357   Type of Therapy/Topic:  Group Therapy:  Emotion Regulation  Participation Level:  Did Not Attend   Mood:  Description of Group:    The purpose of this group is to assist patients in learning to regulate negative emotions and experience positive emotions. Patients will be guided to discuss ways in which they have been vulnerable to their negative emotions. These vulnerabilities will be juxtaposed with experiences of positive emotions or situations, and patients challenged to use positive emotions to combat negative ones. Special emphasis will be placed on coping with negative emotions in conflict situations, and patients will process healthy conflict resolution skills.  Therapeutic Goals: Patient will identify two positive emotions or experiences to reflect on in order to balance out negative emotions:  Patient will label two or more emotions that they find the most difficult to experience:  Patient will be able to demonstrate positive conflict resolution skills through discussion or role plays:   Summary of Patient Progress:   Patient did not attend.     Therapeutic Modalities:   Cognitive Behavioral Therapy Feelings Identification Dialectical Behavioral Therapy   Brandon CHRISTELLA Kerns, LCSW

## 2024-01-20 NOTE — Group Note (Signed)
 Date:  01/20/2024 Time:  8:58 PM  Group Topic/Focus:  Managing Feelings:   The focus of this group is to identify what feelings patients have difficulty handling and develop a plan to handle them in a healthier way upon discharge.    Participation Level:  Active  Participation Quality:  Appropriate and Attentive  Affect:  Appropriate  Cognitive:  Alert and Appropriate  Insight: Appropriate  Engagement in Group:  Engaged  Modes of Intervention:  Discussion and Support  Additional Comments:    Ginny JONETTA Galeazzi 01/20/2024, 8:58 PM

## 2024-01-20 NOTE — Progress Notes (Signed)
" °   01/20/24 1600  Psych Admission Type (Psych Patients Only)  Admission Status Involuntary  Psychosocial Assessment  Patient Complaints Anxiety  Eye Contact Fair  Facial Expression Flat  Affect Appropriate to circumstance  Speech Logical/coherent  Interaction Assertive;Isolative  Motor Activity Other (Comment) (WDL)  Appearance/Hygiene Unremarkable;In scrubs  Behavior Characteristics Cooperative;Appropriate to situation  Mood Depressed  Aggressive Behavior  Effect No apparent injury  Thought Process  Coherency WDL  Content WDL  Delusions None reported or observed  Perception WDL  Hallucination None reported or observed  Judgment Impaired  Confusion None  Danger to Self  Current suicidal ideation? Denies (Denies)  Danger to Others  Danger to Others None reported or observed    "

## 2024-01-21 LAB — LIPID PANEL
Cholesterol: 130 mg/dL (ref 0–200)
HDL: 37 mg/dL — ABNORMAL LOW
LDL Cholesterol: 68 mg/dL (ref 0–99)
Total CHOL/HDL Ratio: 3.6 ratio
Triglycerides: 128 mg/dL
VLDL: 26 mg/dL (ref 0–40)

## 2024-01-21 LAB — HEMOGLOBIN A1C
Hgb A1c MFr Bld: 4.5 % — ABNORMAL LOW (ref 4.8–5.6)
Mean Plasma Glucose: 82.45 mg/dL

## 2024-01-21 MED ORDER — NICOTINE 21 MG/24HR TD PT24
21.0000 mg | MEDICATED_PATCH | Freq: Every day | TRANSDERMAL | Status: DC
  Administered 2024-01-21 – 2024-01-23 (×2): 21 mg via TRANSDERMAL
  Filled 2024-01-21 (×2): qty 1

## 2024-01-21 NOTE — Progress Notes (Signed)
 Pt is A&Ox4. He denies alcohol withdrawal symptoms at this time. He denies SI/HI/AVH this shift. He is safe on the unit with Q15 min safety checks in place.   01/20/24 2110  Psych Admission Type (Psych Patients Only)  Admission Status Involuntary  Psychosocial Assessment  Patient Complaints None  Eye Contact Fair  Facial Expression Animated  Affect Appropriate to circumstance  Speech Logical/coherent  Interaction Assertive  Motor Activity Other (Comment) (WNL)  Appearance/Hygiene In scrubs  Behavior Characteristics Appropriate to situation  Mood Pleasant  Thought Process  Coherency WDL  Content WDL  Delusions None reported or observed  Perception WDL  Hallucination None reported or observed  Judgment Impaired  Confusion None  Danger to Self  Current suicidal ideation? Denies  Danger to Others  Danger to Others None reported or observed

## 2024-01-21 NOTE — Group Note (Signed)
 [facility] LCSW Group Therapy Note   Group Date: 01/21/2024 Start Time: 1335 End Time: 1415  Type of Therapy/Topic:  Group Therapy:  Feelings about Diagnosis  Participation Level:  None   Description of Group:    This group will allow patients to explore their thoughts and feelings about diagnoses they have received. Patients will be guided to explore their level of understanding and acceptance of these diagnoses. Facilitator will encourage patients to process their thoughts and feelings about the reactions of others to their diagnosis, and will guide patients in identifying ways to discuss their diagnosis with significant others in their lives. This group will be process-oriented, with patients participating in exploration of their own experiences as well as giving and receiving support and challenge from other group members.   Therapeutic Goals: 1. Patient will demonstrate understanding of diagnosis as evidence by identifying two or more symptoms of the disorder:  2. Patient will be able to express two feelings regarding the diagnosis 3. Patient will demonstrate ability to communicate their needs through discussion and/or role plays  Summary of Patient Progress: Patient attended group late, once she attended he did not engage in group discussion.    Therapeutic Modalities:   Cognitive Behavioral Therapy Brief Therapy Feelings Identification    Sherryle JINNY Margo, LCSW

## 2024-01-21 NOTE — Group Note (Signed)
 Date:  01/21/2024 Time:  5:07 PM  Group Topic/Focus:  Building Self Esteem:   The Focus of this group is helping patients become aware of the effects of self-esteem on their lives, the things they and others do that enhance or undermine their self-esteem, seeing the relationship between their level of self-esteem and the choices they make and learning ways to enhance self-esteem.    Participation Level:  Minimal  Participation Quality:  Appropriate  Affect:  Appropriate  Cognitive:  Appropriate  Insight: Appropriate  Engagement in Group:  Engaged  Modes of Intervention:  Activity  Additional Comments:    Brandon Owen June 01/21/2024, 5:07 PM

## 2024-01-21 NOTE — Plan of Care (Signed)
  Problem: Education: Goal: Emotional status will improve Outcome: Progressing Goal: Mental status will improve Outcome: Progressing   Problem: Coping: Goal: Ability to verbalize frustrations and anger appropriately will improve Outcome: Progressing Goal: Ability to demonstrate self-control will improve Outcome: Progressing   

## 2024-01-21 NOTE — Group Note (Signed)
 Date:  01/21/2024 Time:  8:50 PM  Group Topic/Focus:  Orientation:   The focus of this group is to educate the patient on the purpose and policies of crisis stabilization and provide a format to answer questions about their admission.  The group details unit policies and expectations of patients while admitted. Wrap-Up Group:   The focus of this group is to help patients review their daily goal of treatment and discuss progress on daily workbooks.    Participation Level:  Active  Participation Quality:  Appropriate and Attentive  Affect:  Appropriate  Cognitive:  Alert and Appropriate  Insight: Appropriate and Good  Engagement in Group:  Engaged  Modes of Intervention:  Orientation  Additional Comments:     Brandon Owen Servant 01/21/2024, 8:50 PM

## 2024-01-21 NOTE — Progress Notes (Signed)
" °   01/21/24 2000  Psych Admission Type (Psych Patients Only)  Admission Status Involuntary  Psychosocial Assessment  Patient Complaints None  Eye Contact Fair  Facial Expression Animated  Affect Appropriate to circumstance  Speech Logical/coherent  Interaction Assertive  Motor Activity Other (Comment) (WDL)  Appearance/Hygiene Unremarkable;In scrubs  Behavior Characteristics Appropriate to situation  Mood Pleasant  Aggressive Behavior  Effect No apparent injury  Thought Process  Coherency WDL  Content WDL  Delusions None reported or observed  Perception WDL  Hallucination None reported or observed  Judgment Limited  Confusion None  Danger to Self  Current suicidal ideation? Denies  Agreement Not to Harm Self Yes  Description of Agreement Verbal  Danger to Others  Danger to Others None reported or observed   Visited by his mother and informed staff that she is supportive.  Present with elevated mood; states, I am happy.  He denied SI, noted that his mother and other family members are his support.  Has two children ages 88 and 22; a boy and a girl and the he is missing them. "

## 2024-01-21 NOTE — Progress Notes (Signed)
 Patient ID: Brandon Owen, male   DOB: 04-30-2000, 24 y.o.   MRN: [id number] [facility] MD Progress Note  01/21/2024 12:07 PM Brandon Owen  MRN:  [id number]   Patient is a 24 year old male who presented to ED for self-inflicted laceration to the left forearm in the context of suicidal ideation and chronic self-injurious behavior. 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 Patient was admitted to the inpatient psychiatric unit for further stabilization and treatment.  Subjective:  Chart reviewed, case discussed in multidisciplinary meeting, patient seen during rounds.   1/20:   01/20/24: Patient is noted to be resting in bed.  He did acknowledge that current admission is in the context of SI and self-harm behaviors of cutting.  He denies any current active SI or urges of self harming behaviors.  He denies auditory/visual hallucinations.  He rates anxiety 4 out of 10, 10 being the worst depression 4 out of 10.  Patient is agreeable to increase dose of Prozac  to 20 mg to optimize the response to antidepressant.  He is encouraged to participate.  Groups and engage with social worker and discharge planning.  Patient reports having a place to go back home  01/19/2024: Patient was seen in his room resting this morning during psychiatric rounds. Patient is alert and oriented X 4, calm, cooperative, and engaged well in conversation. Patient reports his medications are making him sleepy, despite him sleeping during the night. He endorses ongoing depression, which he rates his depression a 4 out of 10 this morning and denies having any anxiety. He denies any withdrawal symptoms and has no observation of acute concerns. Patient relates having a good appetite. Patient has clear, linear thoughts, judgment and insight are intact. He denies having current suicidal or homicidal ideations or perceptual disturbances today. Will continue to  monitor.   Past Psychiatric History: see h&P Family History:  Family History  Problem Relation Age of Onset   GER disease Brother    Social History:  Social History   Substance and Sexual Activity  Alcohol Use Yes     Social History   Substance and Sexual Activity  Drug Use Yes   Types: Marijuana    Social History   Socioeconomic History   Marital status: Single    Spouse name: Not on file   Number of children: Not on file   Years of education: Not on file   Highest education level: Not on file  Occupational History   Not on file  Tobacco Use   Smoking status: Every Day    Types: Cigars    Passive exposure: Current   Smokeless tobacco: Never  Substance and Sexual Activity   Alcohol use: Yes   Drug use: Yes    Types: Marijuana   Sexual activity: Not on file  Other Topics Concern   Not on file  Social History Narrative   Not on file   Social Drivers of Health   Tobacco Use: High Risk (01/20/2024)   Patient History    Smoking Tobacco Use: Every Day    Smokeless Tobacco Use: Never    Passive Exposure: Current  Financial Resource Strain: Not on file  Food Insecurity: No Food Insecurity (01/18/2024)   Epic    Worried About Programme Researcher, Broadcasting/film/video in the Last Year: Never true    Ran Out of Food in the Last Year: Never true  Transportation Needs: No Transportation Needs (01/18/2024)   Epic  Lack of Transportation (Medical): No    Lack of Transportation (Non-Medical): No  Physical Activity: Not on file  Stress: Not on file  Social Connections: Not on file  Depression (PHQ2-9): Low Risk (04/01/2023)   Depression (PHQ2-9)    PHQ-2 Score: 0  Alcohol Screen: Not on file  Housing: High Risk (01/18/2024)   Epic    Unable to Pay for Housing in the Last Year: Yes    Number of Times Moved in the Last Year: 1    Homeless in the Last Year: Yes  Utilities: Not At Risk (01/18/2024)   Epic    Threatened with loss of utilities: No  Health Literacy: Not on file   Past  Medical History: History reviewed. No pertinent past medical history.  Past Surgical History:  Procedure Laterality Date   KNEE ARTHROSCOPY WITH ANTERIOR CRUCIATE LIGAMENT (ACL) REPAIR Left     Current Medications: Current Facility-Administered Medications  Medication Dose Route Frequency Provider Last Rate Last Admin   acetaminophen  (TYLENOL ) tablet 650 mg  650 mg Oral Q6H PRN McLauchlin, Angela, NP   650 mg at 01/20/24 0832   alum & mag hydroxide-simeth (MAALOX/MYLANTA) 200-200-20 MG/5ML suspension 30 mL  30 mL Oral Q4H PRN McLauchlin, Angela, NP       haloperidol  (HALDOL ) tablet 5 mg  5 mg Oral TID PRN McLauchlin, Angela, NP       And   diphenhydrAMINE  (BENADRYL ) capsule 50 mg  50 mg Oral TID PRN McLauchlin, Angela, NP       haloperidol  lactate (HALDOL ) injection 5 mg  5 mg Intramuscular TID PRN McLauchlin, Angela, NP       And   diphenhydrAMINE  (BENADRYL ) injection 50 mg  50 mg Intramuscular TID PRN McLauchlin, Angela, NP       And   LORazepam  (ATIVAN ) injection 2 mg  2 mg Intramuscular TID PRN McLauchlin, Angela, NP       haloperidol  lactate (HALDOL ) injection 10 mg  10 mg Intramuscular TID PRN McLauchlin, Angela, NP       And   diphenhydrAMINE  (BENADRYL ) injection 50 mg  50 mg Intramuscular TID PRN McLauchlin, Angela, NP       And   LORazepam  (ATIVAN ) injection 2 mg  2 mg Intramuscular TID PRN McLauchlin, Angela, NP       FLUoxetine  (PROZAC ) capsule 20 mg  20 mg Oral Daily Jadapalle, Sree, MD   20 mg at 01/21/24 9091   folic acid  (FOLVITE ) tablet 1 mg  1 mg Oral Daily Smith, Annie B, NP   1 mg at 01/21/24 9091   gabapentin  (NEURONTIN ) capsule 100 mg  100 mg Oral TID McLauchlin, Angela, NP   100 mg at 01/21/24 0908   hydrOXYzine  (ATARAX ) tablet 25 mg  25 mg Oral TID PRN McLauchlin, Angela, NP   25 mg at 01/20/24 2108   ibuprofen  (ADVIL ) tablet 600 mg  600 mg Oral Q8H PRN Smith, Annie B, NP       LORazepam  (ATIVAN ) tablet 0-4 mg  0-4 mg Oral Q12H Smith, Annie B, NP       magnesium   hydroxide (MILK OF MAGNESIA) suspension 30 mL  30 mL Oral Daily PRN McLauchlin, Angela, NP       multivitamin with minerals tablet 1 tablet  1 tablet Oral Daily Smith, Annie B, NP   1 tablet at 01/21/24 0908   nicotine  (NICODERM CQ  - dosed in mg/24 hours) patch 21 mg  21 mg Transdermal Daily Jadapalle, Sree, MD   21 mg at  01/21/24 0915   nicotine  polacrilex (NICORETTE ) gum 2 mg  2 mg Oral PRN Smith, Annie B, NP   2 mg at 01/18/24 1736   thiamine  (VITAMIN B1) tablet 100 mg  100 mg Oral Daily Smith, Annie B, NP   100 mg at 01/21/24 9091   Or   thiamine  (VITAMIN B1) injection 100 mg  100 mg Intravenous Daily Smith, Annie B, NP       traZODone  (DESYREL ) tablet 50 mg  50 mg Oral QHS PRN McLauchlin, Angela, NP   50 mg at 01/20/24 2108    Lab Results:  Results for orders placed or performed during the hospital encounter of 01/18/24 (from the past 48 hours)  Lipid panel     Status: Abnormal   Collection Time: 01/21/24  7:25 AM  Result Value Ref Range   Cholesterol 130 0 - 200 mg/dL    Comment:        ATP III CLASSIFICATION:  <200     mg/dL   Desirable  799-760  mg/dL   Borderline High  >=759    mg/dL   High           Triglycerides 128 <150 mg/dL   HDL 37 (L) >59 mg/dL   Total CHOL/HDL Ratio 3.6 RATIO   VLDL 26 0 - 40 mg/dL   LDL Cholesterol 68 0 - 99 mg/dL    Comment:        Total Cholesterol/HDL:CHD Risk Coronary Heart Disease Risk Table                     Men   Women  1/2 Average Risk   3.4   3.3  Average Risk       5.0   4.4  2 X Average Risk   9.6   7.1  3 X Average Risk  23.4   11.0        Use the calculated Patient Ratio above and the CHD Risk Table to determine the patient's CHD Risk.        ATP III CLASSIFICATION (LDL):  <100     mg/dL   Optimal  899-870  mg/dL   Near or Above                    Optimal  130-159  mg/dL   Borderline  839-810  mg/dL   High  >809     mg/dL   Very High Performed at [facility], [address] [place], [place] [address]       Blood Alcohol level:  Lab Results  Component Value Date   [facility] <15 01/17/2024    Metabolic Disorder Labs: No results found for: HGBA1C, MPG No results found for: PROLACTIN Lab Results  Component Value Date   CHOL 130 01/21/2024   TRIG 128 01/21/2024   HDL 37 (L) 01/21/2024   CHOLHDL 3.6 01/21/2024   VLDL 26 01/21/2024   LDLCALC 68 01/21/2024    Physical Findings: AIMS:  , ,  ,  ,    CIWA:  CIWA-Ar Total: 0 COWS:      Psychiatric Specialty Exam:  Presentation  General Appearance:  Appropriate for Environment; Casual  Eye Contact: Fair  Speech: Clear and Coherent  Speech Volume: Normal    Mood and Affect  Mood: Euthymic  Affect: Appropriate; Congruent   Thought Process  Thought Processes: Coherent; Linear  Orientation:Full (Time, Place and Person)  Thought Content:Logical  Hallucinations:denies  Suicidal Thoughts:denies  Homicidal  Thoughts:denies   Sensorium  Memory: Immediate Fair; Recent Fair; Remote Fair  Judgment: Fair  Insight: Fair   Chartered Certified Accountant: Fair  Attention Span: Fair  Recall: [employer] of Knowledge: Fair  Language: Fair   Psychomotor Activity  Psychomotor Activity: Psychomotor Activity: Normal   Musculoskeletal: Strength & Muscle Tone: within normal limits Gait & Station: normal Assets  Assets: Manufacturing Systems Engineer; Desire for Improvement    Physical Exam: Physical Exam ROS Blood pressure (!) 136/93, pulse 69, temperature 97.9 F (36.6 C), temperature source Oral, resp. rate 20, height 6' 2 (1.88 m), weight 102.1 kg, SpO2 100%. Body mass index is 28.89 kg/m.  Diagnosis: Principal Problem:   MDD (major depressive disorder), recurrent episode, severe (HCC)   PLAN: Safety and Monitoring:  -- Involuntary admission to inpatient psychiatric unit for safety, stabilization and treatment  -- Daily contact with patient to assess and evaluate symptoms and  progress in treatment  -- Patient's case to be discussed in multi-disciplinary team meeting  -- Observation Level : q15 minute checks  -- Vital signs:  q12 hours  -- Precautions: suicide, elopement, and assault -- Encouraged patient to participate in unit milieu and in scheduled group therapies  2. Psychiatric Treatment:  Scheduled Medications: -Continue CIWA protocol due to alcohol intake information reported to this provider today during assessment - Increase fluoxetine  20 mg daily to target depressive symptoms - Continue gabapentin  100 mg 3 times daily to target chronic pain/anxiety symptoms as well as alcohol use disorder  Hydroxyzine  25 mg 3 times daily as needed for anxiety - Trazodone  50 mg daily as needed at bedtime for sleep             -- The risks/benefits/side-effects/alternatives to this medication were discussed in detail with the patient and time was given for questions. The patient consents to medication trial.  3. Medical Issues Being Addressed:     4. Discharge Planning:   -- Social work and case management to assist with discharge planning and identification of hospital follow-up needs prior to discharge  -- Estimated LOS: 5-7 days             -- Discharge Concerns: Need to establish a safety plan; Medication compliance and effectiveness             -- Discharge Goals: Return home with outpatient referrals follow ups   Kailash Hinze, NP 01/21/2024, 12:07 PM

## 2024-01-21 NOTE — Group Note (Signed)
 Recreation Therapy Group Note   Group Topic:Animal Assisted Therapy   Group Date: 01/21/2024 Start Time: 1000 End Time: 1030 Facilitators: Celestia Jeoffrey BRAVO, LRT, CTRS Location: Dayroom  Group Description: AAA. Animal-Assisted Activity provides opportunities for motivational, educational, therapeutic and/or recreational benefits to enhance quality of life. Selinda and Rollo visited the unit to interact with patients.   Goal Areas Addressed:  Reduced anxiety and stress Improved mood Increased social interaction Enhanced communication skills Reduced loneliness and isolation Improved emotional regulation   Affect/Mood: Appropriate and Flat   Participation Level: Minimal    Clinical Observations/Individualized Feedback: Bright was originally present in group. Pt left early and did not return. Pt was present for less than half the session.   Plan: Continue to engage patient in RT group sessions 2-3x/week.   Jeoffrey BRAVO Celestia, LRT, CTRS 01/21/2024 11:30 AM

## 2024-01-21 NOTE — Progress Notes (Signed)
" °   01/21/24 1800  Psych Admission Type (Psych Patients Only)  Admission Status Involuntary  Psychosocial Assessment  Patient Complaints None  Eye Contact Fair  Facial Expression Animated  Affect Appropriate to circumstance  Speech Logical/coherent  Interaction Assertive  Motor Activity Other (Comment) (WDL)  Appearance/Hygiene In scrubs  Behavior Characteristics Appropriate to situation  Mood Pleasant  Thought Process  Coherency WDL  Content WDL  Delusions None reported or observed  Perception WDL  Hallucination None reported or observed  Judgment Impaired  Confusion None  Danger to Self  Current suicidal ideation? Denies  Agreement Not to Harm Self Yes  Description of Agreement verbal    "

## 2024-01-21 NOTE — Group Note (Signed)
 Recreation Therapy Group Note   Group Topic:Stress Management  Group Date: 01/21/2024 Start Time: 1530 End Time: 1615 Facilitators: Celestia Jeoffrey BRAVO, LRT, CTRS Location: Dayroom  Group Description: UNO. LRT and pts played games of UNO. LRT prompted group discussion on the physical signs and symptoms of stress, like those you feel when playing a competitive card game. LRT and pt discussed the physical and mental signs of stress, as well as coping skills to manage them.   Goal Area(s) Addressed: Patient will identify physical symptoms of stress. Patient will identify coping skills for stress. Patient will build frustration tolerance skills.  Patient will increase communication.    Affect/Mood: Appropriate   Participation Level: Active and Engaged   Participation Quality: Independent   Behavior: Calm and Cooperative   Speech/Thought Process: Coherent   Insight: Good   Judgement: Good   Modes of Intervention: Competitive Play   Patient Response to Interventions:  Attentive, Engaged, Interested , and Receptive   Education Outcome:  Acknowledges education   Clinical Observations/Individualized Feedback: Mavrick was active in their participation of session activities and group discussion. Pt interacted well with LRT and peers duration of session.    Plan: Continue to engage patient in RT group sessions 2-3x/week.   Jeoffrey BRAVO Celestia, LRT, CTRS 01/21/2024 5:21 PM

## 2024-01-21 NOTE — Plan of Care (Signed)
   Problem: Education: Goal: Emotional status will improve Outcome: Progressing Goal: Mental status will improve Outcome: Progressing Goal: Verbalization of understanding the information provided will improve Outcome: Progressing   Problem: Activity: Goal: Interest or engagement in activities will improve Outcome: Progressing

## 2024-01-22 NOTE — Progress Notes (Signed)
 Pt calm and pleasant during assessment denying SI/HI/AVH. Pt observed by this clinical research associate interacting appropriately with staff and peers on the unit. Pt compliant with medication administration per MD orders. Pt given education, support, and encouragement to be active in his treatment plan. Pt beng monitored Q 15 minutes for safety per unit protocol, remains safe on the unit

## 2024-01-22 NOTE — Plan of Care (Signed)
" °  Problem: Education: Goal: Knowledge of [facility] Education information/materials will improve Outcome: Progressing   Problem: Education: Goal: Emotional status will improve Outcome: Progressing   Problem: Education: Goal: Verbalization of understanding the information provided will improve Outcome: Progressing   Problem: Activity: Goal: Sleeping patterns will improve Outcome: Progressing   Problem: Activity: Goal: Interest or engagement in activities will improve Outcome: Progressing   Problem: Coping: Goal: Ability to demonstrate self-control will improve Outcome: Progressing   Problem: Health Behavior/Discharge Planning: Goal: Identification of resources available to assist in meeting health care needs will improve Outcome: Progressing   "

## 2024-01-22 NOTE — Group Note (Signed)
 Date:  01/22/2024 Time:  5:14 PM  Group Topic/Focus:  Building Self Esteem:   The Focus of this group is helping patients become aware of the effects of self-esteem on their lives, the things they and others do that enhance or undermine their self-esteem, seeing the relationship between their level of self-esteem and the choices they make and learning ways to enhance self-esteem.    Participation Level:  Active  Participation Quality:  Appropriate  Affect:  Appropriate  Cognitive:  Appropriate  Insight: Appropriate  Engagement in Group:  Engaged  Modes of Intervention:  Activity Jon A Aarvi Stotts 01/22/2024, 5:14 PM

## 2024-01-22 NOTE — Progress Notes (Signed)
" °   01/22/24 1157  Psych Admission Type (Psych Patients Only)  Admission Status Involuntary  Psychosocial Assessment  Patient Complaints Other (Comment) (Pt is asking to be discharged. States he has  a lot of bills I need to take care of.)  Eye Contact Fair  Facial Expression Animated  Affect Appropriate to circumstance  Speech Logical/coherent  Interaction Assertive  Motor Activity Other (Comment) (Appropriate)  Appearance/Hygiene Unremarkable  Behavior Characteristics Appropriate to situation  Mood Preoccupied  Thought Process  Coherency WDL  Content WDL  Delusions None reported or observed  Perception WDL  Hallucination None reported or observed  Judgment Limited  Confusion None  Danger to Self  Current suicidal ideation? Denies  Agreement Not to Harm Self Yes  Description of Agreement verbal  Danger to Others  Danger to Others None reported or observed    "

## 2024-01-22 NOTE — Progress Notes (Signed)
 " [facility] MD Progress Note  01/22/2024 4:41 PM Brandon Owen  MRN:  [id number]   Patient is a 24 year old male who presented to ED for self-inflicted laceration to the left forearm in the context of suicidal ideation and chronic self-injurious behavior. 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 Patient was admitted to the inpatient psychiatric unit for further stabilization and treatment.  Subjective:  Chart reviewed, case discussed in multidisciplinary meeting, patient seen during rounds.   1/21: Patient found socializing in dayroom on rounds. He reports he is doing great and is feeling good. 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 He denies SI, HI, and AVH. Discussed purpose of medication again as nursing reported that patient seemed unsure about taking Prozac . He denies concerns/complaints.   1/20: Patient is found in hallway during rounds. He reports he is doing okay but is missing his children. He denies SI, HI, and AVH. He reports that he started self harming himself in October after conflict with his children's mother increased. He reports she controls him through the children and it is frustrating. He endorses differences in their parenting style and that she states she is a woke parent practicing gentle parenting to their 75 and 63 year old children. He reports otherwise he is doing better and is discharge focused.   01/20/24: Patient is noted to be resting in bed.  He did acknowledge that current admission is in the context of SI and self-harm behaviors of cutting.  He denies any current active SI or urges of self harming behaviors.  He denies auditory/visual hallucinations.  He rates anxiety 4 out of 10, 10 being the worst depression 4 out of 10.  Patient is agreeable to increase dose of Prozac  to 20 mg to optimize the response to antidepressant.  He is encouraged to  participate.  Groups and engage with social worker and discharge planning.  Patient reports having a place to go back home  01/19/2024: Patient was seen in his room resting this morning during psychiatric rounds. Patient is alert and oriented X 4, calm, cooperative, and engaged well in conversation. Patient reports his medications are making him sleepy, despite him sleeping during the night. He endorses ongoing depression, which he rates his depression a 4 out of 10 this morning and denies having any anxiety. He denies any withdrawal symptoms and has no observation of acute concerns. Patient relates having a good appetite. Patient has clear, linear thoughts, judgment and insight are intact. He denies having current suicidal or homicidal ideations or perceptual disturbances today. Will continue to monitor.   Past Psychiatric History: see h&P Family History:  Family History  Problem Relation Age of Onset   GER disease Brother    Social History:  Social History   Substance and Sexual Activity  Alcohol Use Yes     Social History   Substance and Sexual Activity  Drug Use Yes   Types: Marijuana    Social History   Socioeconomic History   Marital status: Single    Spouse name: Not on file   Number of children: Not on file   Years of education: Not on file   Highest education level: Not on file  Occupational History   Not on file  Tobacco Use   Smoking status: Every Day    Types: Cigars    Passive exposure: Current   Smokeless tobacco: Never  Substance and Sexual Activity   Alcohol  use: Yes   Drug use: Yes    Types: Marijuana   Sexual activity: Not on file  Other Topics Concern   Not on file  Social History Narrative   Not on file   Social Drivers of Health   Tobacco Use: High Risk (01/20/2024)   Patient History    Smoking Tobacco Use: Every Day    Smokeless Tobacco Use: Never    Passive Exposure: Current  Financial Resource Strain: Not on file  Food Insecurity: No Food  Insecurity (01/18/2024)   Epic    Worried About Programme Researcher, Broadcasting/film/video in the Last Year: Never true    Ran Out of Food in the Last Year: Never true  Transportation Needs: No Transportation Needs (01/18/2024)   Epic    Lack of Transportation (Medical): No    Lack of Transportation (Non-Medical): No  Physical Activity: Not on file  Stress: Not on file  Social Connections: Not on file  Depression (PHQ2-9): Low Risk (04/01/2023)   Depression (PHQ2-9)    PHQ-2 Score: 0  Alcohol Screen: Not on file  Housing: High Risk (01/18/2024)   Epic    Unable to Pay for Housing in the Last Year: Yes    Number of Times Moved in the Last Year: 1    Homeless in the Last Year: Yes  Utilities: Not At Risk (01/18/2024)   Epic    Threatened with loss of utilities: No  Health Literacy: Not on file   Past Medical History: History reviewed. No pertinent past medical history.  Past Surgical History:  Procedure Laterality Date   KNEE ARTHROSCOPY WITH ANTERIOR CRUCIATE LIGAMENT (ACL) REPAIR Left     Current Medications: Current Facility-Administered Medications  Medication Dose Route Frequency Provider Last Rate Last Admin   acetaminophen  (TYLENOL ) tablet 650 mg  650 mg Oral Q6H PRN McLauchlin, Angela, NP   650 mg at 01/22/24 1253   alum & mag hydroxide-simeth (MAALOX/MYLANTA) 200-200-20 MG/5ML suspension 30 mL  30 mL Oral Q4H PRN McLauchlin, Angela, NP       haloperidol  (HALDOL ) tablet 5 mg  5 mg Oral TID PRN McLauchlin, Angela, NP       And   diphenhydrAMINE  (BENADRYL ) capsule 50 mg  50 mg Oral TID PRN McLauchlin, Angela, NP       haloperidol  lactate (HALDOL ) injection 5 mg  5 mg Intramuscular TID PRN McLauchlin, Angela, NP       And   diphenhydrAMINE  (BENADRYL ) injection 50 mg  50 mg Intramuscular TID PRN McLauchlin, Angela, NP       And   LORazepam  (ATIVAN ) injection 2 mg  2 mg Intramuscular TID PRN McLauchlin, Angela, NP       haloperidol  lactate (HALDOL ) injection 10 mg  10 mg Intramuscular TID PRN  McLauchlin, Angela, NP       And   diphenhydrAMINE  (BENADRYL ) injection 50 mg  50 mg Intramuscular TID PRN McLauchlin, Angela, NP       And   LORazepam  (ATIVAN ) injection 2 mg  2 mg Intramuscular TID PRN McLauchlin, Angela, NP       FLUoxetine  (PROZAC ) capsule 20 mg  20 mg Oral Daily Jadapalle, Sree, MD   20 mg at 01/22/24 0816   folic acid  (FOLVITE ) tablet 1 mg  1 mg Oral Daily Smith, Annie B, NP   1 mg at 01/22/24 9184   gabapentin  (NEURONTIN ) capsule 100 mg  100 mg Oral TID McLauchlin, Angela, NP   100 mg at 01/22/24 1615   hydrOXYzine  (ATARAX )  tablet 25 mg  25 mg Oral TID PRN McLauchlin, Angela, NP   25 mg at 01/21/24 2123   ibuprofen  (ADVIL ) tablet 600 mg  600 mg Oral Q8H PRN Smith, Annie B, NP       LORazepam  (ATIVAN ) tablet 0-4 mg  0-4 mg Oral Q12H Smith, Annie B, NP       magnesium  hydroxide (MILK OF MAGNESIA) suspension 30 mL  30 mL Oral Daily PRN McLauchlin, Angela, NP       multivitamin with minerals tablet 1 tablet  1 tablet Oral Daily Smith, Annie B, NP   1 tablet at 01/21/24 9091   nicotine  (NICODERM CQ  - dosed in mg/24 hours) patch 21 mg  21 mg Transdermal Daily Jadapalle, Sree, MD   21 mg at 01/21/24 0915   nicotine  polacrilex (NICORETTE ) gum 2 mg  2 mg Oral PRN Smith, Annie B, NP   2 mg at 01/22/24 1513   thiamine  (VITAMIN B1) tablet 100 mg  100 mg Oral Daily Smith, Annie B, NP   100 mg at 01/21/24 9091   Or   thiamine  (VITAMIN B1) injection 100 mg  100 mg Intravenous Daily Smith, Annie B, NP       traZODone  (DESYREL ) tablet 50 mg  50 mg Oral QHS PRN McLauchlin, Angela, NP   50 mg at 01/21/24 2123    Lab Results:  Results for orders placed or performed during the hospital encounter of 01/18/24 (from the past 48 hours)  Hemoglobin A1c     Status: Abnormal   Collection Time: 01/21/24  7:25 AM  Result Value Ref Range   Hgb A1c MFr Bld 4.5 (L) 4.8 - 5.6 %    Comment: (NOTE) Diagnosis of Diabetes The following HbA1c ranges recommended by the American Diabetes Association  (ADA) Devaun Hernandez be used as an aid in the diagnosis of diabetes mellitus.  Hemoglobin             Suggested A1C NGSP%              Diagnosis  <5.7                   Non Diabetic  5.7-6.4                Pre-Diabetic  >6.4                   Diabetic  <7.0                   Glycemic control for                       adults with diabetes.     Mean Plasma Glucose 82.45 mg/dL    Comment: Performed at [facility], [address], [place], [place] [address]  Lipid panel     Status: Abnormal   Collection Time: 01/21/24  7:25 AM  Result Value Ref Range   Cholesterol 130 0 - 200 mg/dL    Comment:        ATP III CLASSIFICATION:  <200     mg/dL   Desirable  799-760  mg/dL   Borderline High  >=759    mg/dL   High           Triglycerides 128 <150 mg/dL   HDL 37 (L) >59 mg/dL   Total CHOL/HDL Ratio 3.6 RATIO   VLDL 26 0 - 40 mg/dL   LDL Cholesterol 68 0 - 99  mg/dL    Comment:        Total Cholesterol/HDL:CHD Risk Coronary Heart Disease Risk Table                     Men   Women  1/2 Average Risk   3.4   3.3  Average Risk       5.0   4.4  2 X Average Risk   9.6   7.1  3 X Average Risk  23.4   11.0        Use the calculated Patient Ratio above and the CHD Risk Table to determine the patient's CHD Risk.        ATP III CLASSIFICATION (LDL):  <100     mg/dL   Optimal  899-870  mg/dL   Near or Above                    Optimal  130-159  mg/dL   Borderline  839-810  mg/dL   High  >809     mg/dL   Very High Performed at [facility], [address] [place], [place] [address]      Blood Alcohol level:  Lab Results  Component Value Date   [facility] <15 01/17/2024    Metabolic Disorder Labs: Lab Results  Component Value Date   HGBA1C 4.5 (L) 01/21/2024   MPG 82.45 01/21/2024   No results found for: PROLACTIN Lab Results  Component Value Date   CHOL 130 01/21/2024   TRIG 128 01/21/2024   HDL 37 (L) 01/21/2024   CHOLHDL 3.6 01/21/2024   VLDL 26 01/21/2024    LDLCALC 68 01/21/2024    Physical Findings: AIMS:  , ,  ,  ,    CIWA:  CIWA-Ar Total: 0 COWS:      Psychiatric Specialty Exam:  Presentation  General Appearance:  Appropriate for Environment; Casual  Eye Contact: Fair  Speech: Clear and Coherent; Normal Rate  Speech Volume: Normal    Mood and Affect  Mood: Euthymic  Affect: Appropriate; Congruent   Thought Process  Thought Processes: Coherent; Linear  Orientation:Full (Time, Place and Person)  Thought Content:Logical  Hallucinations:denies  Suicidal Thoughts:denies  Homicidal Thoughts:denies   Sensorium  Memory: Immediate Fair; Recent Fair; Remote Fair  Judgment: Fair  Insight: Fair   Art Therapist  Concentration: Fair  Attention Span: Fair  Recall: [employer] of Knowledge: Fair  Language: Fair   Psychomotor Activity  Psychomotor Activity: Psychomotor Activity: Normal   Musculoskeletal: Strength & Muscle Tone: within normal limits Gait & Station: normal Assets  Assets: Manufacturing Systems Engineer; Desire for Improvement; Housing; Health And Safety Inspector; Vocational/Educational    Physical Exam: Physical Exam Vitals and nursing note reviewed.  Constitutional:      Appearance: Normal appearance.  Pulmonary:     Effort: Pulmonary effort is normal.  Neurological:     Mental Status: He is alert and oriented to person, place, and time.  Psychiatric:        Mood and Affect: Mood normal.        Behavior: Behavior normal.        Thought Content: Thought content normal.    Review of Systems  Respiratory:  Negative for shortness of breath.   Cardiovascular:  Negative for chest pain.  Gastrointestinal:  Negative for diarrhea, nausea and vomiting.  Psychiatric/Behavioral:  Positive for substance abuse. Negative for depression, hallucinations and suicidal ideas.   All other systems reviewed and are negative.  Blood pressure ROLLEN)  148/81, pulse 83, temperature (!)  97.5 F (36.4 C), temperature source Oral, resp. rate 18, height 6' 2 (1.88 m), weight 102.1 kg, SpO2 99%. Body mass index is 28.89 kg/m.  Diagnosis: Principal Problem:   MDD (major depressive disorder), recurrent episode, severe (HCC)   PLAN: Safety and Monitoring:  -- Involuntary admission to inpatient psychiatric unit for safety, stabilization and treatment  -- Daily contact with patient to assess and evaluate symptoms and progress in treatment  -- Patient's case to be discussed in multi-disciplinary team meeting  -- Observation Level : q15 minute checks  -- Vital signs:  q12 hours  -- Precautions: suicide, elopement, and assault -- Encouraged patient to participate in unit milieu and in scheduled group therapies   2. Psychiatric Treatment:  Scheduled Medications: -D/C CIWA protocol due to low CIWA score and denying withdrawal for >3 days - Fluoxetine  20 mg daily to target depressive symptoms - Continue gabapentin  100 mg 3 times daily to target chronic pain/anxiety symptoms as well as alcohol use disorder  - Hydroxyzine  25 mg 3 times daily as needed for anxiety - Trazodone  50 mg daily as needed at bedtime for sleep             -- The risks/benefits/side-effects/alternatives to this medication were discussed in detail with the patient and time was given for questions. The patient consents to medication trial.  3. Medical Issues Being Addressed:  - No acute concerns expressed    4. Discharge Planning:   -- Social work and case management to assist with discharge planning and identification of hospital follow-up needs prior to discharge  -- Estimated LOS: 5-7 days             -- Discharge Concerns: Need to establish a safety plan; Medication compliance and effectiveness             -- Discharge Goals: Return home with outpatient referrals follow ups  Likely discharge Friday.    Dorris Vangorder, NP 01/22/2024, 4:41 PM  "

## 2024-01-22 NOTE — Group Note (Signed)
 [facility] LCSW Group Therapy Note   Group Date: 01/22/2024 Start Time: 1300 End Time: 1400   Type of Therapy/Topic:  Group Therapy:  Emotion Regulation  Participation Level:  Active   Description of Group:    The purpose of this group is to assist patients in learning to regulate negative emotions and experience positive emotions. Patients will be guided to discuss ways in which they have been vulnerable to their negative emotions. These vulnerabilities will be juxtaposed with experiences of positive emotions or situations, and patients challenged to use positive emotions to combat negative ones. Special emphasis will be placed on coping with negative emotions in conflict situations, and patients will process healthy conflict resolution skills.  Therapeutic Goals: Patient will identify two positive emotions or experiences to reflect on in order to balance out negative emotions:  Patient will label two or more emotions that they find the most difficult to experience:  Patient will be able to demonstrate positive conflict resolution skills through discussion or role plays:   Summary of Patient Progress: Patient was present for the entirety of the group process. He was actively engaged in the conversation and his comments helped further the discussion. Pt appeared to have some insight into himself and the topic. He appeared open and receptive to feedback/comments from both his peers and the facilitator.   Therapeutic Modalities:   Cognitive Behavioral Therapy Feelings Identification Dialectical Behavioral Therapy   Nadara JONELLE Fam, LCSW

## 2024-01-22 NOTE — Group Note (Signed)
 Date:  01/22/2024 Time:  9:11 PM  Group Topic/Focus:  Wrap-Up Group:   The focus of this group is to help patients review their daily goal of treatment and discuss progress on daily workbooks.    Participation Level:  Active  Participation Quality:  Appropriate  Affect:  Appropriate  Cognitive:  Appropriate  Insight: Appropriate  Engagement in Group:  Engaged  Modes of Intervention:  Discussion and Support  Additional Comments:    Ginny JONETTA Galeazzi 01/22/2024, 9:11 PM

## 2024-01-22 NOTE — Plan of Care (Signed)

## 2024-01-22 NOTE — BHH Counselor (Signed)
 CSW contacted the patient's probation officer, Officer B [place], with [facility], [phone].  CSW informed patient was admitted at patient's request.   CSW transferred call so pt and Officer could speak.  No issue indicated.   Sherryle Margo, MSW, LCSW 01/22/2024 9:37 AM

## 2024-01-22 NOTE — Group Note (Signed)
 Date:  01/22/2024 Time:  1:40 PM  Group Topic/Focus:  Stages of Change:   The focus of this group is to explain the stages of change and help patients identify changes they want to make upon discharge.    Participation Level:  Did Not Attend   Camellia HERO Jacquel Mccamish 01/22/2024, 1:40 PM

## 2024-01-22 NOTE — Progress Notes (Signed)
 Pt is currently resting quietly in room.   Breathing even and unlabored.   Q 15 min checks in place.   Will cont to monitor for safety.

## 2024-01-23 MED ORDER — FLUOXETINE HCL 20 MG PO CAPS: 20.0000 mg | ORAL_CAPSULE | Freq: Every day | ORAL | 0 refills | Status: AC

## 2024-01-23 MED ORDER — NICOTINE 21 MG/24HR TD PT24: 21.0000 mg | MEDICATED_PATCH | Freq: Every day | TRANSDERMAL | 0 refills | Status: AC

## 2024-01-23 MED ORDER — TRAZODONE HCL 50 MG PO TABS: 50.0000 mg | ORAL_TABLET | Freq: Every evening | ORAL | 0 refills | Status: AC | PRN

## 2024-01-23 MED ORDER — HYDROXYZINE HCL 25 MG PO TABS: 25.0000 mg | ORAL_TABLET | Freq: Three times a day (TID) | ORAL | 0 refills | Status: AC | PRN

## 2024-01-23 MED ORDER — NICOTINE POLACRILEX 2 MG MT GUM: 2.0000 mg | CHEWING_GUM | OROMUCOSAL | 0 refills | Status: AC | PRN

## 2024-01-23 NOTE — BHH Counselor (Signed)
 CSW spoke with the patient's grandmother, Conette Pettiford.  She confirms that the patient can come to her home.  She reports that she will provide transportation for the patient to return to her home.   She reports that there are no firearms in the home.  She reports no concerns at this time.   Sherryle Margo, MSW, LCSW 01/23/2024 1:01 PM

## 2024-01-23 NOTE — Progress Notes (Signed)
" °  [facility] Adult Case Management Discharge Plan :  Will you be returning to the same living situation after discharge:  No. At discharge, do you have transportation home?: Yes,  pt reports that he will stay with grandmother who is providing transportation. Do you have the ability to pay for your medications: Yes,  CIG[employer] / CIG[employer] MANAGED  Release of information consent forms completed and in the chart;  Patient's signature needed at discharge.  Patient to Follow up at:  Follow-up Information     [facility] Follow up.   Why: Your therapy appointment is scheduled for Monday, 01/27/24 at 11AM.   Your psychiatry appointment is scheduled for Thursday, 02/13/24 at 10 AM.  You will receive an email to set up your patient portal. Contact information: [address] [place] [address] [phone]                 Next level of care provider has access to [facility] Link:no  Safety Planning and Suicide Prevention discussed: Yes,  SPE completed with the patient and patient's grandmother.     Has patient been referred to the Quitline?: Patient refused referral for treatment  Patient has been referred for addiction treatment: Yes, the patient will follow up with an outpatient provider for substance use disorder. Psychiatrist/APP: appointment made  Sherryle JINNY Margo, LCSW 01/23/2024, 12:58 PM "

## 2024-01-23 NOTE — Plan of Care (Deleted)

## 2024-01-23 NOTE — BHH Counselor (Signed)
 CSW has sent fax to patient's probation officer at his request, informing of admission.  Sherryle Margo, MSW, LCSW 01/23/2024 8:42 AM

## 2024-01-23 NOTE — Plan of Care (Signed)

## 2024-01-23 NOTE — BHH Suicide Risk Assessment (Cosign Needed)
 [facility] Discharge Suicide Risk Assessment   Principal Problem: MDD (major depressive disorder), recurrent episode, severe (HCC) Discharge Diagnoses: Principal Problem:   MDD (major depressive disorder), recurrent episode, severe (HCC)   Total Time spent with patient: 1 hour  Musculoskeletal: Strength & Muscle Tone: within normal limits Gait & Station: normal Patient leans: N/A  Psychiatric Specialty Exam  Presentation  General Appearance:  Appropriate for Environment; Casual  Eye Contact: Fair  Speech: Clear and Coherent; Normal Rate  Speech Volume: Normal  Handedness:No data recorded  Mood and Affect  Mood: Euthymic  Duration of Depression Symptoms: Greater than two weeks  Affect: Appropriate; Congruent   Thought Process  Thought Processes: Coherent; Linear  Descriptions of Associations:Intact  Orientation:Full (Time, Place and Person)  Thought Content:Logical  History of Schizophrenia/Schizoaffective disorder:No  Duration of Psychotic Symptoms:No data recorded Hallucinations:Hallucinations: None  Ideas of Reference:None  Suicidal Thoughts:Suicidal Thoughts: No  Homicidal Thoughts:Homicidal Thoughts: No   Sensorium  Memory: Immediate Fair; Recent Fair; Remote Fair  Judgment: Fair  Insight: Fair   Art Therapist  Concentration: Fair  Attention Span: Fair  Recall: [employer] of Knowledge: Fair  Language: Fair   Psychomotor Activity  Psychomotor Activity: Psychomotor Activity: Normal   Assets  Assets: Communication Skills; Desire for Improvement; Housing; Health And Safety Inspector; Vocational/Educational   Sleep  Sleep: Sleep: Fair  Estimated Sleeping Duration (Last 24 Hours): 7.75-8.50 hours  Physical Exam: Physical Exam Vitals and nursing note reviewed.  Constitutional:      Appearance: Normal appearance.  Cardiovascular:     Rate and Rhythm: Normal rate.  Pulmonary:     Effort: Pulmonary effort  is normal.  Skin:    Findings: Lesion present.     Comments: Sutured cut to left forearm  Neurological:     Mental Status: He is alert.  Psychiatric:        Mood and Affect: Mood normal.        Behavior: Behavior normal.        Thought Content: Thought content normal.    Review of Systems  Respiratory:  Negative for shortness of breath.   Cardiovascular:  Negative for chest pain.  Gastrointestinal:  Negative for diarrhea, nausea and vomiting.  Psychiatric/Behavioral:  Positive for substance abuse. Negative for depression, hallucinations and suicidal ideas. The patient is not nervous/anxious.   All other systems reviewed and are negative.  Blood pressure 122/76, pulse 66, temperature (!) 97.5 F (36.4 C), temperature source Oral, resp. rate 17, height 6' 2 (1.88 m), weight 102.1 kg, SpO2 99%. Body mass index is 28.89 kg/m.  Mental Status Per Nursing Assessment::   On Admission:  Self-harm behaviors  Demographic Factors:  Male and Adolescent or young adult  Loss Factors: Loss of significant relationship  Historical Factors: Impulsivity  Risk Reduction Factors:   Responsible for children under 34 years of age, Sense of responsibility to family, Living with another person, especially a relative, and Positive social support  Continued Clinical Symptoms:  Previous Psychiatric Diagnoses and Treatments  Cognitive Features That Contribute To Risk:  None    Suicide Risk:  Minimal: No identifiable suicidal ideation.  Patients presenting with no risk factors but with morbid ruminations; Merrilyn Legler be classified as minimal risk based on the severity of the depressive symptoms   Follow-up Information     [facility] Follow up.   Why: Your therapy appointment is scheduled for Monday, 01/27/24 at 11AM.   Your psychiatry appointment is scheduled for Thursday, 02/13/24 at 10 AM.  You  will receive an email to set up your patient portal. Contact information: [address], [address] [place] [address] [phone]                 Plan Of Care/Follow-up recommendations:  Follow up with outpatient appointments listed above   Shah Insley, NP 01/23/2024, 1:12 PM

## 2024-01-23 NOTE — Group Note (Signed)
 Recreation Therapy Group Note   Group Topic:Goal Setting  Group Date: 01/23/2024 Start Time: 1000 End Time: 1110 Facilitators: Celestia Jeoffrey FORBES ARTICE, CTRS Location: Craft Room  Group Description: Vision Boards. Patients were given many different magazines, a glue stick, markers, and a piece of cardstock paper. LRT and pts discussed the importance of having goals in life. LRT and pts discussed the difference between short-term and long-term goals, as well as what a SMART goal is. LRT encouraged pts to create a vision board, with images they picked and then cut out with safety scissors from the magazine, for themselves, that capture their short and long-term goals. LRT encouraged pts to show and explain their vision board to the group.   Goal Area(s) Addressed:  Patient will gain knowledge of short vs. long term goals.  Patient will identify goals for themselves. Patient will practice setting SMART goals. Patient will verbalize their goals to LRT and peers.   Affect/Mood: Appropriate   Participation Level: Active and Engaged   Participation Quality: Independent   Behavior: Calm and Cooperative   Speech/Thought Process: Coherent   Insight: Good   Judgement: Good   Modes of Intervention: Art   Patient Response to Interventions:  Attentive, Engaged, Interested , and Receptive   Education Outcome:  Acknowledges education   Clinical Observations/Individualized Feedback: Brandon Owen was active in their participation of session activities and group discussion. Pt identified I want to get closer to my family and finish my degree in lobbyist.    Plan: Continue to engage patient in RT group sessions 2-3x/week.   Jeoffrey FORBES Celestia, LRT, CTRS 01/23/2024 11:35 AM

## 2024-01-23 NOTE — Progress Notes (Signed)
" °   01/23/24 1100  Psych Admission Type (Psych Patients Only)  Admission Status Involuntary  Psychosocial Assessment  Patient Complaints None  Eye Contact Fair  Facial Expression Flat  Affect Appropriate to circumstance  Speech Logical/coherent  Interaction Assertive  Motor Activity Slow  Appearance/Hygiene Unremarkable  Behavior Characteristics Cooperative;Appropriate to situation;Calm  Mood Pleasant  Thought Process  Coherency WDL  Content WDL  Delusions None reported or observed  Perception WDL  Hallucination None reported or observed  Judgment WDL  Confusion None  Danger to Self  Current suicidal ideation? Denies  Agreement Not to Harm Self Yes  Description of Agreement verbal  Danger to Others  Danger to Others None reported or observed    "

## 2024-01-23 NOTE — Group Note (Signed)
 Date:  01/23/2024 Time:  10:00 AM  Group Topic/Focus:  Emotional Education:   The focus of this group is to discuss what feelings/emotions are, and how they are experienced.    Participation Level:  Active  Participation Quality:  Appropriate  Affect:  Appropriate  Cognitive:  Alert  Insight: Appropriate  Engagement in Group:  Engaged  Modes of Intervention:  Activity, Discussion, and Education  Additional Comments:    Kirstine Jacquin L Saran Laviolette 01/23/2024, 10:00 AM

## 2024-01-23 NOTE — Plan of Care (Signed)
   Problem: Education: Goal: Emotional status will improve Outcome: Progressing Goal: Mental status will improve Outcome: Progressing

## 2024-01-23 NOTE — Progress Notes (Signed)
 Discharge Note:  Patient denies SI/HI/AVH at this time. Discharge instructions, AVS, prescriptions, and transition record gone over with patient. Patient agrees to comply with medication management, follow-up visit, and outpatient therapy. Patient belongings returned to patient. Patient questions and concerns addressed and answered. Patient ambulatory off unit. Patient discharged to home with grandmother.
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
## 2024-01-28 ENCOUNTER — Ambulatory Visit: Admitting: Family Medicine

## 2024-01-28 ENCOUNTER — Emergency Department
Admission: EM | Admit: 2024-01-28 | Discharge: 2024-01-28 | Disposition: A | Discharge status: Home / Self Care | Attending: Emergency Medicine | Admitting: Emergency Medicine

## 2024-01-28 ENCOUNTER — Other Ambulatory Visit: Payer: Self-pay

## 2024-01-28 DIAGNOSIS — Z4802 Encounter for removal of sutures: Secondary | ICD-10-CM | POA: Diagnosis present

## 2024-01-28 NOTE — ED Triage Notes (Signed)
 Pt sts that he is here to get his sutures out that have been in his arm for the last 10 days.

## 2024-01-28 NOTE — ED Provider Notes (Signed)
" ° °  [facility] Provider Note    Event Date/Time   First MD Initiated Contact with Patient 01/28/24 1037     (approximate)   History   Suture / Staple Removal   HPI  Brandon Owen is a 24 y.o. male   presents to the ED to have suture removal from his arm.  Sutures have been in for 10 days.  He denies any concerns of infection.      Physical Exam   Triage Vital Signs: ED Triage Vitals  Encounter Vitals Group     BP 01/28/24 1024 (!) 142/90     Girls Systolic BP Percentile --      Girls Diastolic BP Percentile --      Boys Systolic BP Percentile --      Boys Diastolic BP Percentile --      Pulse Rate 01/28/24 1024 75     Resp 01/28/24 1024 17     Temp 01/28/24 1024 98 F (36.7 C)     Temp Source 01/28/24 1024 Oral     SpO2 01/28/24 1024 95 %     Weight 01/28/24 1025 230 lb (104.3 kg)     Height 01/28/24 1025 6' 2 (1.88 m)     Head Circumference --      Peak Flow --      Pain Score 01/28/24 1025 4     Pain Loc --      Pain Education --      Exclude from Growth Chart --     Most recent vital signs: Vitals:   01/28/24 1024  BP: (!) 142/90  Pulse: 75  Resp: 17  Temp: 98 F (36.7 C)  SpO2: 95%     General: Awake, no distress.  CV:  Good peripheral perfusion.  Resp:  Normal effort.  Abd:  No distention.  Other:  Left forearm with suture site appearing to be healing without any signs of infection.  No drainage or erythema.   ED Results / Procedures / Treatments   Labs (all labs ordered are listed, but only abnormal results are displayed) Labs Reviewed - No data to display    PROCEDURES:  Critical Care performed:   Procedures   MEDICATIONS ORDERED IN ED: Medications - No data to display   IMPRESSION / MDM / ASSESSMENT AND PLAN / ED COURSE  I reviewed the triage vital signs and the nursing notes.   Differential diagnosis includes, but is not limited to, suture removal.  24 year old male presents to the ED for suture  removal.  Sutures have been in for 10 days and no concern for infection.      Patient's presentation is most consistent with acute, uncomplicated illness.  FINAL CLINICAL IMPRESSION(S) / ED DIAGNOSES   Final diagnoses:  Encounter for removal of sutures     Rx / DC Orders   ED Discharge Orders     None        Note:  This document was prepared using Dragon voice recognition software and may include unintentional dictation errors.   Saunders Shona CROME, PA-C 01/28/24 1052    Claudene Rover, MD 01/28/24 1442  "

## 2024-01-28 NOTE — ED Notes (Signed)
 9 sutures removed without difficulty.  Pt states one suture had already fallen out.  Edges well approximated.

## 2024-01-28 NOTE — Progress Notes (Unsigned)
" ° °  Established Patient Office Visit  Subjective   Patient ID: Brandon Owen, male    DOB: 06-05-2000  Age: 24 y.o. MRN: [id number]  No chief complaint on file.   HPI   {History (Optional):23778}  ROS    Objective:     There were no vitals taken for this visit. {Vitals History (Optional):23777}  Physical Exam ***  {Perform Simple Foot Exam  Perform Detailed exam:1} {Insert foot Exam (Optional):30965}   No results found for any visits on 01/28/24.  {Labs (Optional):23779}  The ASCVD Risk score (Arnett DK, et al., 2019) failed to calculate for the following reasons:   The 2019 ASCVD risk score is only valid for ages 53 to 77   * - Cholesterol units were assumed    Assessment & Plan:  There are no diagnoses linked to this encounter.   Return in about 4 weeks (around 02/25/2024) for physical exam with labs one week prior.    Menelik Mcfarren K Danie Diehl, MD "

## 2024-01-28 NOTE — Discharge Instructions (Signed)
Follow-up with your primary care provider if any continued problems or concerns.

## 2024-02-05 ENCOUNTER — Inpatient Hospital Stay: Admitting: Family Medicine
